# Patient Record
Sex: Female | Born: 1955
Health system: Southern US, Community
[De-identification: ages and names within clinical notes are randomized; demographics above are authoritative.]

## PROBLEM LIST (undated history)

## (undated) DIAGNOSIS — H269 Unspecified cataract: Secondary | ICD-10-CM

## (undated) DIAGNOSIS — Z8489 Family history of other specified conditions: Secondary | ICD-10-CM

## (undated) DIAGNOSIS — M1990 Unspecified osteoarthritis, unspecified site: Secondary | ICD-10-CM

## (undated) DIAGNOSIS — R519 Headache, unspecified: Secondary | ICD-10-CM

## (undated) DIAGNOSIS — R011 Cardiac murmur, unspecified: Secondary | ICD-10-CM

## (undated) DIAGNOSIS — R51 Headache: Secondary | ICD-10-CM

## (undated) DIAGNOSIS — M8580 Other specified disorders of bone density and structure, unspecified site: Secondary | ICD-10-CM

## (undated) HISTORY — PX: AUGMENTATION MAMMAPLASTY: SUR837

## (undated) HISTORY — PX: FACIAL COSMETIC SURGERY: SHX629

## (undated) HISTORY — PX: OTHER SURGICAL HISTORY: SHX169

## (undated) HISTORY — DX: Other specified disorders of bone density and structure, unspecified site: M85.80

## (undated) HISTORY — PX: BREAST SURGERY: SHX581

## (undated) HISTORY — PX: EYE SURGERY: SHX253

## (undated) HISTORY — PX: LASIK: SHX215

---

## 1997-10-05 ENCOUNTER — Ambulatory Visit (HOSPITAL_COMMUNITY): Admission: RE | Admit: 1997-10-05 | Discharge: 1997-10-05 | Payer: Self-pay | Admitting: Gynecology

## 1997-12-10 ENCOUNTER — Other Ambulatory Visit: Admission: RE | Admit: 1997-12-10 | Discharge: 1997-12-10 | Payer: Self-pay | Admitting: Gynecology

## 1998-11-16 ENCOUNTER — Other Ambulatory Visit: Admission: RE | Admit: 1998-11-16 | Discharge: 1998-11-16 | Payer: Self-pay | Admitting: Gynecology

## 1998-12-02 ENCOUNTER — Ambulatory Visit (HOSPITAL_COMMUNITY): Admission: RE | Admit: 1998-12-02 | Discharge: 1998-12-02 | Payer: Self-pay | Admitting: Gynecology

## 1998-12-02 ENCOUNTER — Encounter: Payer: Self-pay | Admitting: Gynecology

## 1999-11-16 ENCOUNTER — Other Ambulatory Visit: Admission: RE | Admit: 1999-11-16 | Discharge: 1999-11-16 | Payer: Self-pay | Admitting: Gynecology

## 2000-03-08 ENCOUNTER — Encounter: Payer: Self-pay | Admitting: Gynecology

## 2000-03-08 ENCOUNTER — Ambulatory Visit (HOSPITAL_COMMUNITY): Admission: RE | Admit: 2000-03-08 | Discharge: 2000-03-08 | Payer: Self-pay | Admitting: Gynecology

## 2000-03-28 ENCOUNTER — Other Ambulatory Visit: Admission: RE | Admit: 2000-03-28 | Discharge: 2000-03-28 | Payer: Self-pay | Admitting: Gynecology

## 2000-09-19 ENCOUNTER — Other Ambulatory Visit: Admission: RE | Admit: 2000-09-19 | Discharge: 2000-09-19 | Payer: Self-pay | Admitting: Gynecology

## 2000-12-11 ENCOUNTER — Other Ambulatory Visit: Admission: RE | Admit: 2000-12-11 | Discharge: 2000-12-11 | Payer: Self-pay | Admitting: Gynecology

## 2001-07-02 ENCOUNTER — Ambulatory Visit (HOSPITAL_COMMUNITY): Admission: RE | Admit: 2001-07-02 | Discharge: 2001-07-02 | Payer: Self-pay | Admitting: Gynecology

## 2001-07-02 ENCOUNTER — Encounter: Payer: Self-pay | Admitting: Gynecology

## 2001-09-16 ENCOUNTER — Encounter (INDEPENDENT_AMBULATORY_CARE_PROVIDER_SITE_OTHER): Payer: Self-pay | Admitting: Specialist

## 2001-09-16 ENCOUNTER — Ambulatory Visit (HOSPITAL_COMMUNITY): Admission: RE | Admit: 2001-09-16 | Discharge: 2001-09-16 | Payer: Self-pay | Admitting: Gastroenterology

## 2001-11-20 ENCOUNTER — Other Ambulatory Visit: Admission: RE | Admit: 2001-11-20 | Discharge: 2001-11-20 | Payer: Self-pay | Admitting: Gynecology

## 2002-07-22 ENCOUNTER — Ambulatory Visit (HOSPITAL_COMMUNITY): Admission: RE | Admit: 2002-07-22 | Discharge: 2002-07-22 | Payer: Self-pay | Admitting: Gynecology

## 2002-07-22 ENCOUNTER — Encounter: Payer: Self-pay | Admitting: Gynecology

## 2002-11-18 ENCOUNTER — Other Ambulatory Visit: Admission: RE | Admit: 2002-11-18 | Discharge: 2002-11-18 | Payer: Self-pay | Admitting: Gynecology

## 2003-08-19 ENCOUNTER — Ambulatory Visit (HOSPITAL_COMMUNITY): Admission: RE | Admit: 2003-08-19 | Discharge: 2003-08-19 | Payer: Self-pay | Admitting: Gynecology

## 2003-11-25 ENCOUNTER — Other Ambulatory Visit: Admission: RE | Admit: 2003-11-25 | Discharge: 2003-11-25 | Payer: Self-pay | Admitting: Gynecology

## 2004-06-27 ENCOUNTER — Encounter: Admission: RE | Admit: 2004-06-27 | Discharge: 2004-06-27 | Payer: Self-pay | Admitting: Gynecology

## 2004-09-30 ENCOUNTER — Ambulatory Visit (HOSPITAL_COMMUNITY): Admission: RE | Admit: 2004-09-30 | Discharge: 2004-09-30 | Payer: Self-pay | Admitting: Gynecology

## 2004-11-17 ENCOUNTER — Other Ambulatory Visit: Admission: RE | Admit: 2004-11-17 | Discharge: 2004-11-17 | Payer: Self-pay | Admitting: Gynecology

## 2005-10-26 ENCOUNTER — Ambulatory Visit (HOSPITAL_COMMUNITY): Admission: RE | Admit: 2005-10-26 | Discharge: 2005-10-26 | Payer: Self-pay | Admitting: Gynecology

## 2005-11-15 ENCOUNTER — Other Ambulatory Visit: Admission: RE | Admit: 2005-11-15 | Discharge: 2005-11-15 | Payer: Self-pay | Admitting: Gynecology

## 2006-11-19 ENCOUNTER — Other Ambulatory Visit: Admission: RE | Admit: 2006-11-19 | Discharge: 2006-11-19 | Payer: Self-pay | Admitting: Gynecology

## 2006-12-03 ENCOUNTER — Ambulatory Visit (HOSPITAL_COMMUNITY): Admission: RE | Admit: 2006-12-03 | Discharge: 2006-12-03 | Payer: Self-pay | Admitting: Gynecology

## 2007-04-04 ENCOUNTER — Encounter: Admission: RE | Admit: 2007-04-04 | Discharge: 2007-04-04 | Payer: Self-pay | Admitting: Orthopedic Surgery

## 2007-11-26 ENCOUNTER — Other Ambulatory Visit: Admission: RE | Admit: 2007-11-26 | Discharge: 2007-11-26 | Payer: Self-pay | Admitting: Gynecology

## 2007-12-03 ENCOUNTER — Ambulatory Visit (HOSPITAL_COMMUNITY): Admission: RE | Admit: 2007-12-03 | Discharge: 2007-12-03 | Payer: Self-pay | Admitting: Gynecology

## 2008-12-18 ENCOUNTER — Ambulatory Visit (HOSPITAL_COMMUNITY): Admission: RE | Admit: 2008-12-18 | Discharge: 2008-12-18 | Payer: Self-pay | Admitting: Gynecology

## 2009-12-23 ENCOUNTER — Ambulatory Visit (HOSPITAL_COMMUNITY): Admission: RE | Admit: 2009-12-23 | Discharge: 2009-12-23 | Payer: Self-pay | Admitting: Gynecology

## 2010-08-07 ENCOUNTER — Encounter: Payer: Self-pay | Admitting: Gynecology

## 2010-11-23 ENCOUNTER — Other Ambulatory Visit (HOSPITAL_COMMUNITY): Payer: Self-pay | Admitting: Gynecology

## 2010-11-23 DIAGNOSIS — Z1231 Encounter for screening mammogram for malignant neoplasm of breast: Secondary | ICD-10-CM

## 2010-12-30 ENCOUNTER — Ambulatory Visit (HOSPITAL_COMMUNITY)
Admission: RE | Admit: 2010-12-30 | Discharge: 2010-12-30 | Disposition: A | Discharge status: Home / Self Care | Payer: 59 | Source: Ambulatory Visit | Attending: Gynecology | Admitting: Gynecology

## 2010-12-30 DIAGNOSIS — Z1231 Encounter for screening mammogram for malignant neoplasm of breast: Secondary | ICD-10-CM

## 2011-12-25 ENCOUNTER — Other Ambulatory Visit (HOSPITAL_COMMUNITY): Payer: Self-pay | Admitting: Gynecology

## 2011-12-25 DIAGNOSIS — Z1231 Encounter for screening mammogram for malignant neoplasm of breast: Secondary | ICD-10-CM

## 2011-12-26 ENCOUNTER — Ambulatory Visit (HOSPITAL_COMMUNITY)
Admission: RE | Admit: 2011-12-26 | Discharge: 2011-12-26 | Disposition: A | Discharge status: Home / Self Care | Payer: 59 | Source: Ambulatory Visit | Attending: Gynecology | Admitting: Gynecology

## 2011-12-26 DIAGNOSIS — Z1231 Encounter for screening mammogram for malignant neoplasm of breast: Secondary | ICD-10-CM

## 2012-02-21 ENCOUNTER — Ambulatory Visit (HOSPITAL_COMMUNITY): Payer: 59

## 2012-03-20 ENCOUNTER — Ambulatory Visit (HOSPITAL_COMMUNITY): Payer: 59

## 2012-03-28 ENCOUNTER — Ambulatory Visit (HOSPITAL_COMMUNITY): Payer: 59

## 2012-05-06 ENCOUNTER — Other Ambulatory Visit: Payer: Self-pay | Admitting: Dermatology

## 2012-10-31 ENCOUNTER — Other Ambulatory Visit: Payer: Self-pay | Admitting: Orthopedic Surgery

## 2012-10-31 DIAGNOSIS — M25561 Pain in right knee: Secondary | ICD-10-CM

## 2012-11-06 ENCOUNTER — Ambulatory Visit
Admission: RE | Admit: 2012-11-06 | Discharge: 2012-11-06 | Disposition: A | Discharge status: Home / Self Care | Payer: 59 | Source: Ambulatory Visit | Attending: Orthopedic Surgery | Admitting: Orthopedic Surgery

## 2012-11-06 DIAGNOSIS — M25561 Pain in right knee: Secondary | ICD-10-CM

## 2013-05-01 ENCOUNTER — Other Ambulatory Visit: Payer: Self-pay | Admitting: Orthopedic Surgery

## 2013-05-01 DIAGNOSIS — M79672 Pain in left foot: Secondary | ICD-10-CM

## 2013-05-02 ENCOUNTER — Ambulatory Visit
Admission: RE | Admit: 2013-05-02 | Discharge: 2013-05-02 | Disposition: A | Discharge status: Home / Self Care | Payer: 59 | Source: Ambulatory Visit | Attending: Orthopedic Surgery | Admitting: Orthopedic Surgery

## 2013-05-02 ENCOUNTER — Other Ambulatory Visit: Payer: 59

## 2013-05-02 DIAGNOSIS — M79672 Pain in left foot: Secondary | ICD-10-CM

## 2014-04-22 ENCOUNTER — Other Ambulatory Visit: Payer: Self-pay | Admitting: Gynecology

## 2014-04-24 ENCOUNTER — Other Ambulatory Visit: Payer: Self-pay | Admitting: Gynecology

## 2014-04-24 DIAGNOSIS — R928 Other abnormal and inconclusive findings on diagnostic imaging of breast: Secondary | ICD-10-CM

## 2014-04-24 LAB — CYTOLOGY - PAP

## 2014-04-30 ENCOUNTER — Ambulatory Visit
Admission: RE | Admit: 2014-04-30 | Discharge: 2014-04-30 | Disposition: A | Discharge status: Home / Self Care | Payer: 59 | Source: Ambulatory Visit | Attending: Gynecology | Admitting: Gynecology

## 2014-04-30 ENCOUNTER — Encounter (INDEPENDENT_AMBULATORY_CARE_PROVIDER_SITE_OTHER): Payer: Self-pay

## 2014-04-30 ENCOUNTER — Other Ambulatory Visit: Payer: Self-pay | Admitting: Gynecology

## 2014-04-30 DIAGNOSIS — R928 Other abnormal and inconclusive findings on diagnostic imaging of breast: Secondary | ICD-10-CM

## 2014-05-06 ENCOUNTER — Other Ambulatory Visit: Payer: 59

## 2014-12-31 ENCOUNTER — Other Ambulatory Visit: Payer: Self-pay | Admitting: Orthopedic Surgery

## 2014-12-31 DIAGNOSIS — M25522 Pain in left elbow: Secondary | ICD-10-CM

## 2015-01-10 ENCOUNTER — Other Ambulatory Visit: Payer: Self-pay

## 2015-01-25 ENCOUNTER — Ambulatory Visit
Admission: RE | Admit: 2015-01-25 | Discharge: 2015-01-25 | Disposition: A | Discharge status: Home / Self Care | Payer: BLUE CROSS/BLUE SHIELD | Source: Ambulatory Visit | Attending: Orthopedic Surgery | Admitting: Orthopedic Surgery

## 2015-01-25 ENCOUNTER — Other Ambulatory Visit: Payer: Self-pay

## 2015-01-25 DIAGNOSIS — M25522 Pain in left elbow: Secondary | ICD-10-CM

## 2016-07-26 DIAGNOSIS — L97501 Non-pressure chronic ulcer of other part of unspecified foot limited to breakdown of skin: Secondary | ICD-10-CM | POA: Diagnosis not present

## 2016-07-26 DIAGNOSIS — L84 Corns and callosities: Secondary | ICD-10-CM | POA: Diagnosis not present

## 2016-08-17 DIAGNOSIS — H16141 Punctate keratitis, right eye: Secondary | ICD-10-CM | POA: Diagnosis not present

## 2016-08-17 DIAGNOSIS — H16142 Punctate keratitis, left eye: Secondary | ICD-10-CM | POA: Diagnosis not present

## 2016-10-04 DIAGNOSIS — H40023 Open angle with borderline findings, high risk, bilateral: Secondary | ICD-10-CM | POA: Diagnosis not present

## 2016-10-04 DIAGNOSIS — H43813 Vitreous degeneration, bilateral: Secondary | ICD-10-CM | POA: Diagnosis not present

## 2016-10-04 DIAGNOSIS — Z961 Presence of intraocular lens: Secondary | ICD-10-CM | POA: Diagnosis not present

## 2016-10-10 DIAGNOSIS — D2261 Melanocytic nevi of right upper limb, including shoulder: Secondary | ICD-10-CM | POA: Diagnosis not present

## 2016-10-10 DIAGNOSIS — L821 Other seborrheic keratosis: Secondary | ICD-10-CM | POA: Diagnosis not present

## 2016-10-10 DIAGNOSIS — D1801 Hemangioma of skin and subcutaneous tissue: Secondary | ICD-10-CM | POA: Diagnosis not present

## 2016-10-12 DIAGNOSIS — H16142 Punctate keratitis, left eye: Secondary | ICD-10-CM | POA: Diagnosis not present

## 2016-12-04 DIAGNOSIS — M5416 Radiculopathy, lumbar region: Secondary | ICD-10-CM | POA: Diagnosis not present

## 2017-01-04 DIAGNOSIS — M7751 Other enthesopathy of right foot: Secondary | ICD-10-CM | POA: Diagnosis not present

## 2017-02-01 DIAGNOSIS — M545 Low back pain: Secondary | ICD-10-CM | POA: Diagnosis not present

## 2017-02-01 DIAGNOSIS — M25551 Pain in right hip: Secondary | ICD-10-CM | POA: Diagnosis not present

## 2017-02-05 DIAGNOSIS — M5416 Radiculopathy, lumbar region: Secondary | ICD-10-CM | POA: Diagnosis not present

## 2017-02-05 DIAGNOSIS — S39012D Strain of muscle, fascia and tendon of lower back, subsequent encounter: Secondary | ICD-10-CM | POA: Diagnosis not present

## 2017-02-08 DIAGNOSIS — M5416 Radiculopathy, lumbar region: Secondary | ICD-10-CM | POA: Diagnosis not present

## 2017-02-08 DIAGNOSIS — S39012D Strain of muscle, fascia and tendon of lower back, subsequent encounter: Secondary | ICD-10-CM | POA: Diagnosis not present

## 2017-02-12 ENCOUNTER — Other Ambulatory Visit: Payer: Self-pay | Admitting: Orthopedic Surgery

## 2017-02-12 DIAGNOSIS — M545 Low back pain: Secondary | ICD-10-CM

## 2017-02-12 DIAGNOSIS — M5416 Radiculopathy, lumbar region: Secondary | ICD-10-CM | POA: Diagnosis not present

## 2017-02-12 DIAGNOSIS — S39012D Strain of muscle, fascia and tendon of lower back, subsequent encounter: Secondary | ICD-10-CM | POA: Diagnosis not present

## 2017-02-15 DIAGNOSIS — S39012D Strain of muscle, fascia and tendon of lower back, subsequent encounter: Secondary | ICD-10-CM | POA: Diagnosis not present

## 2017-02-15 DIAGNOSIS — M5416 Radiculopathy, lumbar region: Secondary | ICD-10-CM | POA: Diagnosis not present

## 2017-02-17 DIAGNOSIS — M545 Low back pain: Secondary | ICD-10-CM | POA: Diagnosis not present

## 2017-02-19 DIAGNOSIS — S39012D Strain of muscle, fascia and tendon of lower back, subsequent encounter: Secondary | ICD-10-CM | POA: Diagnosis not present

## 2017-02-19 DIAGNOSIS — M5416 Radiculopathy, lumbar region: Secondary | ICD-10-CM | POA: Diagnosis not present

## 2017-02-22 ENCOUNTER — Other Ambulatory Visit: Payer: BLUE CROSS/BLUE SHIELD

## 2017-02-27 DIAGNOSIS — M7138 Other bursal cyst, other site: Secondary | ICD-10-CM | POA: Diagnosis not present

## 2017-02-27 DIAGNOSIS — M5416 Radiculopathy, lumbar region: Secondary | ICD-10-CM | POA: Diagnosis not present

## 2017-02-27 DIAGNOSIS — M546 Pain in thoracic spine: Secondary | ICD-10-CM | POA: Diagnosis not present

## 2017-02-27 DIAGNOSIS — M4316 Spondylolisthesis, lumbar region: Secondary | ICD-10-CM | POA: Diagnosis not present

## 2017-02-27 DIAGNOSIS — M549 Dorsalgia, unspecified: Secondary | ICD-10-CM | POA: Diagnosis not present

## 2017-02-28 ENCOUNTER — Other Ambulatory Visit: Payer: BLUE CROSS/BLUE SHIELD

## 2017-02-28 DIAGNOSIS — M7138 Other bursal cyst, other site: Secondary | ICD-10-CM | POA: Diagnosis not present

## 2017-02-28 DIAGNOSIS — M5136 Other intervertebral disc degeneration, lumbar region: Secondary | ICD-10-CM | POA: Diagnosis not present

## 2017-02-28 DIAGNOSIS — M48061 Spinal stenosis, lumbar region without neurogenic claudication: Secondary | ICD-10-CM | POA: Diagnosis not present

## 2017-03-28 DIAGNOSIS — H16223 Keratoconjunctivitis sicca, not specified as Sjogren's, bilateral: Secondary | ICD-10-CM | POA: Diagnosis not present

## 2017-03-28 DIAGNOSIS — H18001 Unspecified corneal deposit, right eye: Secondary | ICD-10-CM | POA: Diagnosis not present

## 2017-04-02 DIAGNOSIS — N950 Postmenopausal bleeding: Secondary | ICD-10-CM | POA: Diagnosis not present

## 2017-04-10 DIAGNOSIS — M4316 Spondylolisthesis, lumbar region: Secondary | ICD-10-CM | POA: Diagnosis not present

## 2017-04-10 DIAGNOSIS — M4156 Other secondary scoliosis, lumbar region: Secondary | ICD-10-CM | POA: Diagnosis not present

## 2017-04-10 DIAGNOSIS — M7138 Other bursal cyst, other site: Secondary | ICD-10-CM | POA: Diagnosis not present

## 2017-04-16 DIAGNOSIS — Z23 Encounter for immunization: Secondary | ICD-10-CM | POA: Diagnosis not present

## 2017-05-14 DIAGNOSIS — M5136 Other intervertebral disc degeneration, lumbar region: Secondary | ICD-10-CM | POA: Diagnosis not present

## 2017-05-14 DIAGNOSIS — M7138 Other bursal cyst, other site: Secondary | ICD-10-CM | POA: Diagnosis not present

## 2017-05-14 DIAGNOSIS — M4316 Spondylolisthesis, lumbar region: Secondary | ICD-10-CM | POA: Diagnosis not present

## 2017-05-17 DIAGNOSIS — M48061 Spinal stenosis, lumbar region without neurogenic claudication: Secondary | ICD-10-CM | POA: Diagnosis not present

## 2017-05-17 DIAGNOSIS — M4726 Other spondylosis with radiculopathy, lumbar region: Secondary | ICD-10-CM | POA: Diagnosis not present

## 2017-05-17 DIAGNOSIS — M5136 Other intervertebral disc degeneration, lumbar region: Secondary | ICD-10-CM | POA: Diagnosis not present

## 2017-05-24 DIAGNOSIS — T23171A Burn of first degree of right wrist, initial encounter: Secondary | ICD-10-CM | POA: Diagnosis not present

## 2017-05-29 ENCOUNTER — Other Ambulatory Visit: Payer: Self-pay | Admitting: Neurosurgery

## 2017-05-29 DIAGNOSIS — M4316 Spondylolisthesis, lumbar region: Secondary | ICD-10-CM | POA: Diagnosis not present

## 2017-05-29 DIAGNOSIS — M7138 Other bursal cyst, other site: Secondary | ICD-10-CM | POA: Diagnosis not present

## 2017-05-29 DIAGNOSIS — M47816 Spondylosis without myelopathy or radiculopathy, lumbar region: Secondary | ICD-10-CM | POA: Diagnosis not present

## 2017-05-29 DIAGNOSIS — M4156 Other secondary scoliosis, lumbar region: Secondary | ICD-10-CM | POA: Diagnosis not present

## 2017-06-06 DIAGNOSIS — Z6821 Body mass index (BMI) 21.0-21.9, adult: Secondary | ICD-10-CM | POA: Diagnosis not present

## 2017-06-06 DIAGNOSIS — Z01419 Encounter for gynecological examination (general) (routine) without abnormal findings: Secondary | ICD-10-CM | POA: Diagnosis not present

## 2017-06-06 DIAGNOSIS — Z1231 Encounter for screening mammogram for malignant neoplasm of breast: Secondary | ICD-10-CM | POA: Diagnosis not present

## 2017-06-12 DIAGNOSIS — L820 Inflamed seborrheic keratosis: Secondary | ICD-10-CM | POA: Diagnosis not present

## 2017-07-13 NOTE — Pre-Procedure Instructions (Signed)
Heidi Skinner  07/13/2017      [facility] [place], [place] [address] Phone: [phone] Fax: [phone]    Your procedure is scheduled on  Thursday 07/19/17  Report to [facility] Admitting at 530 A.M.  Call this number if you have problems the morning of surgery:  [phone]   Remember:  Do not eat food or drink liquids after midnight.  Take these medicines the morning of surgery with A SIP OF WATER  TYLENOL IF NEEDED, EYE DROPS  7 days prior to surgery STOP taking any Aspirin(unless otherwise instructed by your surgeon), Aleve, Naproxen, DICLOFENAC/VOLTAREN,  Ibuprofen, Motrin, Advil, Goody's, BC's, all herbal medications, fish oil, and all vitamins   Do not wear jewelry, make-up or nail polish.  Do not wear lotions, powders, or perfumes, or deodorant.  Do not shave 48 hours prior to surgery.  Men may shave face and neck.  Do not bring valuables to the hospital.  [facility] is not responsible for any belongings or valuables.  Contacts, dentures or bridgework may not be worn into surgery.  Leave your suitcase in the car.  After surgery it may be brought to your room.  For patients admitted to the hospital, discharge time will be determined by your treatment team.  Patients discharged the day of surgery will not be allowed to drive home.   Name and phone number of your driver:    Special instructions:  [place] - Preparing for Surgery  Before surgery, you can play an important role.  Because skin is not sterile, your skin needs to be as free of germs as possible.  You can reduce the number of germs on you skin by washing with CHG (chlorahexidine gluconate) soap before surgery.  CHG is an antiseptic cleaner which kills germs and bonds with the skin to continue killing germs even after washing.  Please DO NOT use if you have an allergy to CHG or antibacterial soaps.  If your  skin becomes reddened/irritated stop using the CHG and inform your nurse when you arrive at Short Stay.  Do not shave (including legs and underarms) for at least 48 hours prior to the first CHG shower.  You may shave your face.  Please follow these instructions carefully:   1.  Shower with CHG Soap the night before surgery and the                                morning of Surgery.  2.  If you choose to wash your hair, wash your hair first as usual with your       normal shampoo.  3.  After you shampoo, rinse your hair and body thoroughly to remove the                      Shampoo.  4.  Use CHG as you would any other liquid soap.  You can apply chg directly       to the skin and wash gently with scrungie or a clean washcloth.  5.  Apply the CHG Soap to your body ONLY FROM THE NECK DOWN.        Do not use on open wounds or open sores.  Avoid contact with your eyes,       ears, mouth and genitals (private parts).  Wash genitals (  private parts)       with your normal soap.  6.  Wash thoroughly, paying special attention to the area where your surgery        will be performed.  7.  Thoroughly rinse your body with warm water from the neck down.  8.  DO NOT shower/wash with your normal soap after using and rinsing off       the CHG Soap.  9.  Pat yourself dry with a clean towel.            10.  Wear clean pajamas.            11.  Place clean sheets on your bed the night of your first shower and do not        sleep with pets.  Day of Surgery  Do not apply any lotions/deoderants the morning of surgery.  Please wear clean clothes to the hospital/surgery center.    Please read over the following fact sheets that you were given. MRSA Information and Surgical Site Infection Prevention

## 2017-07-16 ENCOUNTER — Encounter (HOSPITAL_COMMUNITY)
Admission: RE | Admit: 2017-07-16 | Discharge: 2017-07-16 | Disposition: A | Discharge status: Home / Self Care | Payer: 59 | Source: Ambulatory Visit | Attending: Neurosurgery | Admitting: Neurosurgery

## 2017-07-16 ENCOUNTER — Encounter (HOSPITAL_COMMUNITY): Payer: Self-pay

## 2017-07-16 ENCOUNTER — Other Ambulatory Visit: Payer: Self-pay

## 2017-07-16 DIAGNOSIS — M4726 Other spondylosis with radiculopathy, lumbar region: Secondary | ICD-10-CM | POA: Diagnosis not present

## 2017-07-16 DIAGNOSIS — M7138 Other bursal cyst, other site: Secondary | ICD-10-CM | POA: Diagnosis not present

## 2017-07-16 DIAGNOSIS — M48061 Spinal stenosis, lumbar region without neurogenic claudication: Secondary | ICD-10-CM | POA: Diagnosis not present

## 2017-07-16 HISTORY — DX: Headache: R51

## 2017-07-16 HISTORY — DX: Family history of other specified conditions: Z84.89

## 2017-07-16 HISTORY — DX: Unspecified cataract: H26.9

## 2017-07-16 HISTORY — DX: Unspecified osteoarthritis, unspecified site: M19.90

## 2017-07-16 HISTORY — DX: Cardiac murmur, unspecified: R01.1

## 2017-07-16 HISTORY — DX: Headache, unspecified: R51.9

## 2017-07-16 LAB — BASIC METABOLIC PANEL
Anion gap: 9 (ref 5–15)
BUN: 13 mg/dL (ref 6–20)
CO2: 25 mmol/L (ref 22–32)
Calcium: 9.8 mg/dL (ref 8.9–10.3)
Chloride: 101 mmol/L (ref 101–111)
Creatinine, Ser: 0.69 mg/dL (ref 0.44–1.00)
GFR calc Af Amer: >60 mL/min (ref >60)
GFR calc non Af Amer: >60 mL/min (ref >60)
Glucose, Bld: 90 mg/dL (ref 65–99)
Potassium: 4 mmol/L (ref 3.5–5.1)
Sodium: 135 mmol/L (ref 135–145)

## 2017-07-16 LAB — CBC
HCT: 39 % (ref 36.0–46.0)
Hemoglobin: 12.8 g/dL (ref 12.0–15.0)
MCH: 31.8 pg (ref 26.0–34.0)
MCHC: 32.8 g/dL (ref 30.0–36.0)
MCV: 97 fL (ref 78.0–100.0)
Platelets: 237 10*3/uL (ref 150–400)
RBC: 4.02 MIL/uL (ref 3.87–5.11)
RDW: 13.6 % (ref 11.5–15.5)
WBC: 5.9 10*3/uL (ref 4.0–10.5)

## 2017-07-16 LAB — TYPE AND SCREEN
ABO/RH(D): O POS
Antibody Screen: NEGATIVE

## 2017-07-16 LAB — SURGICAL PCR SCREEN
MRSA, PCR: NEGATIVE
Staphylococcus aureus: NEGATIVE

## 2017-07-16 LAB — ABO/RH: ABO/RH(D): O POS

## 2017-07-16 NOTE — Progress Notes (Addendum)
PCP is Dr. Lavone Orn  LOV 08/2015?   Approx 10 - 15 yrs ago, she was experiencing palpitations.  Had a stress test ( - ) seen by Dr. Linard Millers.  Then, when pregnant with first child, she was told she had murmur.  With subsequent pregnancies, nothing was said or mentioned.  Has not been back since. Denies cp, sob, palpitations.

## 2017-07-19 ENCOUNTER — Encounter (HOSPITAL_COMMUNITY): Payer: Self-pay | Admitting: *Deleted

## 2017-07-19 ENCOUNTER — Encounter (HOSPITAL_COMMUNITY)
Admission: RE | Disposition: A | Discharge status: Home / Self Care | Payer: Self-pay | Source: Ambulatory Visit | Attending: Neurosurgery

## 2017-07-19 ENCOUNTER — Inpatient Hospital Stay (HOSPITAL_COMMUNITY): Payer: 59 | Admitting: Anesthesiology

## 2017-07-19 ENCOUNTER — Other Ambulatory Visit: Payer: Self-pay

## 2017-07-19 ENCOUNTER — Inpatient Hospital Stay (HOSPITAL_COMMUNITY): Payer: 59

## 2017-07-19 ENCOUNTER — Inpatient Hospital Stay (HOSPITAL_COMMUNITY)
Admission: RE | Admit: 2017-07-19 | Discharge: 2017-07-20 | DRG: 455 | Disposition: A | Discharge status: Home / Self Care | Payer: 59 | Source: Ambulatory Visit | Attending: Neurosurgery | Admitting: Neurosurgery

## 2017-07-19 DIAGNOSIS — Z880 Allergy status to penicillin: Secondary | ICD-10-CM

## 2017-07-19 DIAGNOSIS — Z791 Long term (current) use of non-steroidal anti-inflammatories (NSAID): Secondary | ICD-10-CM

## 2017-07-19 DIAGNOSIS — M4316 Spondylolisthesis, lumbar region: Secondary | ICD-10-CM | POA: Diagnosis not present

## 2017-07-19 DIAGNOSIS — Z79899 Other long term (current) drug therapy: Secondary | ICD-10-CM

## 2017-07-19 DIAGNOSIS — Z882 Allergy status to sulfonamides status: Secondary | ICD-10-CM

## 2017-07-19 DIAGNOSIS — M4726 Other spondylosis with radiculopathy, lumbar region: Secondary | ICD-10-CM | POA: Diagnosis not present

## 2017-07-19 DIAGNOSIS — M48061 Spinal stenosis, lumbar region without neurogenic claudication: Secondary | ICD-10-CM | POA: Diagnosis not present

## 2017-07-19 DIAGNOSIS — M4326 Fusion of spine, lumbar region: Secondary | ICD-10-CM | POA: Diagnosis not present

## 2017-07-19 DIAGNOSIS — M7138 Other bursal cyst, other site: Secondary | ICD-10-CM | POA: Diagnosis present

## 2017-07-19 DIAGNOSIS — Z419 Encounter for procedure for purposes other than remedying health state, unspecified: Secondary | ICD-10-CM

## 2017-07-19 HISTORY — PX: BACK SURGERY: SHX140

## 2017-07-19 SURGERY — POSTERIOR LUMBAR FUSION 1 LEVEL
Anesthesia: General | Site: Spine Lumbar

## 2017-07-19 MED ORDER — MENTHOL 3 MG MT LOZG: 1.0000 | LOZENGE | OROMUCOSAL | Status: DC | PRN

## 2017-07-19 MED ORDER — THROMBIN (RECOMBINANT) 20000 UNITS EX SOLR
CUTANEOUS | Status: AC
  Filled 2017-07-19: qty 20000

## 2017-07-19 MED ORDER — DEXAMETHASONE SODIUM PHOSPHATE 10 MG/ML IJ SOLN
INTRAMUSCULAR | Status: AC
  Filled 2017-07-19: qty 1

## 2017-07-19 MED ORDER — EPHEDRINE SULFATE-NACL 50-0.9 MG/10ML-% IV SOSY
PREFILLED_SYRINGE | INTRAVENOUS | Status: DC | PRN
  Administered 2017-07-19 (×2): 10 mg via INTRAVENOUS

## 2017-07-19 MED ORDER — SODIUM CHLORIDE 0.9 % IV SOLN: 250.0000 mL | INTRAVENOUS | Status: DC

## 2017-07-19 MED ORDER — CYCLOBENZAPRINE HCL 5 MG PO TABS: 5.0000 mg | ORAL_TABLET | Freq: Three times a day (TID) | ORAL | Status: DC | PRN

## 2017-07-19 MED ORDER — VANCOMYCIN HCL 1000 MG IV SOLR
INTRAVENOUS | Status: DC | PRN
  Administered 2017-07-19: 1000 mg via INTRAVENOUS

## 2017-07-19 MED ORDER — ACETAMINOPHEN 325 MG PO TABS: 650.0000 mg | ORAL_TABLET | ORAL | Status: DC | PRN

## 2017-07-19 MED ORDER — BISACODYL 10 MG RE SUPP: 10.0000 mg | Freq: Every day | RECTAL | Status: DC | PRN

## 2017-07-19 MED ORDER — SODIUM CHLORIDE 0.9% FLUSH
3.0000 mL | INTRAVENOUS | Status: DC | PRN
Start: 2017-07-19 — End: 2017-07-20

## 2017-07-19 MED ORDER — ROCURONIUM BROMIDE 100 MG/10ML IV SOLN
INTRAVENOUS | Status: DC | PRN
  Administered 2017-07-19: 50 mg via INTRAVENOUS

## 2017-07-19 MED ORDER — BUPIVACAINE HCL (PF) 0.5 % IJ SOLN
INTRAMUSCULAR | Status: DC | PRN
  Administered 2017-07-19: 15 mL

## 2017-07-19 MED ORDER — MIDAZOLAM HCL 2 MG/2ML IJ SOLN
INTRAMUSCULAR | Status: AC
  Filled 2017-07-19: qty 2

## 2017-07-19 MED ORDER — CHLORHEXIDINE GLUCONATE CLOTH 2 % EX PADS: 6.0000 | MEDICATED_PAD | Freq: Once | CUTANEOUS | Status: DC

## 2017-07-19 MED ORDER — FENTANYL CITRATE (PF) 250 MCG/5ML IJ SOLN
INTRAMUSCULAR | Status: AC
  Filled 2017-07-19: qty 5

## 2017-07-19 MED ORDER — HYDROXYZINE HCL 25 MG PO TABS: 50.0000 mg | ORAL_TABLET | ORAL | Status: DC | PRN

## 2017-07-19 MED ORDER — SODIUM CHLORIDE 0.9 % IV BOLUS (SEPSIS)
500.0000 mL | Freq: Once | INTRAVENOUS | Status: AC
  Administered 2017-07-19: 500 mL via INTRAVENOUS

## 2017-07-19 MED ORDER — GENTAMICIN IN SALINE 1.6-0.9 MG/ML-% IV SOLN
80.0000 mg | INTRAVENOUS | Status: AC
  Administered 2017-07-19: 80 mg via INTRAVENOUS
  Filled 2017-07-19: qty 50

## 2017-07-19 MED ORDER — 0.9 % SODIUM CHLORIDE (POUR BTL) OPTIME
TOPICAL | Status: DC | PRN
Start: 2017-07-19 — End: 2017-07-19
  Administered 2017-07-19 (×2): 1000 mL

## 2017-07-19 MED ORDER — ONDANSETRON HCL 4 MG/2ML IJ SOLN
INTRAMUSCULAR | Status: AC
  Filled 2017-07-19: qty 2

## 2017-07-19 MED ORDER — THROMBIN (RECOMBINANT) 20000 UNITS EX SOLR
CUTANEOUS | Status: DC | PRN
  Administered 2017-07-19: 09:00:00 via TOPICAL

## 2017-07-19 MED ORDER — PHENOL 1.4 % MT LIQD: 1.0000 | OROMUCOSAL | Status: DC | PRN

## 2017-07-19 MED ORDER — VANCOMYCIN HCL IN DEXTROSE 1-5 GM/200ML-% IV SOLN
1000.0000 mg | INTRAVENOUS | Status: DC
  Filled 2017-07-19: qty 200

## 2017-07-19 MED ORDER — BUPIVACAINE HCL (PF) 0.5 % IJ SOLN
INTRAMUSCULAR | Status: AC
  Filled 2017-07-19: qty 30

## 2017-07-19 MED ORDER — SODIUM CHLORIDE 0.9 % IR SOLN
Status: DC | PRN
  Administered 2017-07-19: 09:00:00

## 2017-07-19 MED ORDER — SODIUM CHLORIDE 0.9% FLUSH
3.0000 mL | Freq: Two times a day (BID) | INTRAVENOUS | Status: DC
  Administered 2017-07-19: 3 mL via INTRAVENOUS

## 2017-07-19 MED ORDER — FLEET ENEMA 7-19 GM/118ML RE ENEM: 1.0000 | ENEMA | Freq: Once | RECTAL | Status: DC | PRN

## 2017-07-19 MED ORDER — DEXTROSE 5 % IV SOLN
INTRAVENOUS | Status: DC | PRN
  Administered 2017-07-19: 20 ug/min via INTRAVENOUS

## 2017-07-19 MED ORDER — MEPERIDINE HCL 25 MG/ML IJ SOLN: 6.2500 mg | INTRAMUSCULAR | Status: DC | PRN

## 2017-07-19 MED ORDER — LIDOCAINE-EPINEPHRINE 1 %-1:100000 IJ SOLN
INTRAMUSCULAR | Status: AC
  Filled 2017-07-19: qty 1

## 2017-07-19 MED ORDER — CEFAZOLIN SODIUM-DEXTROSE 2-4 GM/100ML-% IV SOLN
INTRAVENOUS | Status: AC
  Filled 2017-07-19: qty 100

## 2017-07-19 MED ORDER — PROPOFOL 10 MG/ML IV BOLUS
INTRAVENOUS | Status: DC | PRN
  Administered 2017-07-19: 100 mg via INTRAVENOUS

## 2017-07-19 MED ORDER — THROMBIN (RECOMBINANT) 5000 UNITS EX SOLR
OROMUCOSAL | Status: DC | PRN
  Administered 2017-07-19: 09:00:00 via TOPICAL

## 2017-07-19 MED ORDER — ALUM & MAG HYDROXIDE-SIMETH 200-200-20 MG/5ML PO SUSP: 30.0000 mL | Freq: Four times a day (QID) | ORAL | Status: DC | PRN

## 2017-07-19 MED ORDER — ONDANSETRON HCL 4 MG/2ML IJ SOLN: 4.0000 mg | Freq: Once | INTRAMUSCULAR | Status: DC | PRN

## 2017-07-19 MED ORDER — KETOROLAC TROMETHAMINE 30 MG/ML IJ SOLN
INTRAMUSCULAR | Status: AC
  Administered 2017-07-19: 30 mg via INTRAVENOUS
  Filled 2017-07-19: qty 1

## 2017-07-19 MED ORDER — DEXAMETHASONE SODIUM PHOSPHATE 4 MG/ML IJ SOLN
INTRAMUSCULAR | Status: DC | PRN
  Administered 2017-07-19: 10 mg via INTRAVENOUS

## 2017-07-19 MED ORDER — MAGNESIUM HYDROXIDE 400 MG/5ML PO SUSP: 30.0000 mL | Freq: Every day | ORAL | Status: DC | PRN

## 2017-07-19 MED ORDER — LACTATED RINGERS IV SOLN
INTRAVENOUS | Status: DC | PRN
  Administered 2017-07-19 (×2): via INTRAVENOUS

## 2017-07-19 MED ORDER — LIDOCAINE 2% (20 MG/ML) 5 ML SYRINGE
INTRAMUSCULAR | Status: AC
  Filled 2017-07-19: qty 5

## 2017-07-19 MED ORDER — FENTANYL CITRATE (PF) 100 MCG/2ML IJ SOLN
INTRAMUSCULAR | Status: DC | PRN
  Administered 2017-07-19: 25 ug via INTRAVENOUS
  Administered 2017-07-19: 50 ug via INTRAVENOUS
  Administered 2017-07-19: 25 ug via INTRAVENOUS
  Administered 2017-07-19: 100 ug via INTRAVENOUS
  Administered 2017-07-19: 50 ug via INTRAVENOUS

## 2017-07-19 MED ORDER — HYDROMORPHONE HCL 1 MG/ML IJ SOLN
INTRAMUSCULAR | Status: AC
  Filled 2017-07-19: qty 1

## 2017-07-19 MED ORDER — KCL IN DEXTROSE-NACL 20-5-0.45 MEQ/L-%-% IV SOLN: INTRAVENOUS | Status: DC

## 2017-07-19 MED ORDER — PROPOFOL 10 MG/ML IV BOLUS
INTRAVENOUS | Status: AC
  Filled 2017-07-19: qty 40

## 2017-07-19 MED ORDER — PHENYLEPHRINE 40 MCG/ML (10ML) SYRINGE FOR IV PUSH (FOR BLOOD PRESSURE SUPPORT)
PREFILLED_SYRINGE | INTRAVENOUS | Status: DC | PRN
  Administered 2017-07-19 (×2): 80 ug via INTRAVENOUS
  Administered 2017-07-19: 40 ug via INTRAVENOUS
  Administered 2017-07-19: 80 ug via INTRAVENOUS
  Administered 2017-07-19 (×2): 40 ug via INTRAVENOUS

## 2017-07-19 MED ORDER — ACETAMINOPHEN 10 MG/ML IV SOLN
INTRAVENOUS | Status: DC | PRN
  Administered 2017-07-19: 1000 mg via INTRAVENOUS

## 2017-07-19 MED ORDER — MORPHINE SULFATE (PF) 4 MG/ML IV SOLN: 4.0000 mg | INTRAVENOUS | Status: DC | PRN

## 2017-07-19 MED ORDER — ONDANSETRON HCL 4 MG/2ML IJ SOLN
INTRAMUSCULAR | Status: DC | PRN
  Administered 2017-07-19: 4 mg via INTRAVENOUS

## 2017-07-19 MED ORDER — HYDROMORPHONE HCL 1 MG/ML IJ SOLN
0.2500 mg | INTRAMUSCULAR | Status: DC | PRN
  Administered 2017-07-19: 0.5 mg via INTRAVENOUS

## 2017-07-19 MED ORDER — KETOROLAC TROMETHAMINE 30 MG/ML IJ SOLN
30.0000 mg | Freq: Four times a day (QID) | INTRAMUSCULAR | Status: DC
  Administered 2017-07-19 – 2017-07-20 (×4): 30 mg via INTRAVENOUS
  Filled 2017-07-19 (×4): qty 1

## 2017-07-19 MED ORDER — HYDROCODONE-ACETAMINOPHEN 5-325 MG PO TABS
1.0000 | ORAL_TABLET | ORAL | Status: DC | PRN
  Administered 2017-07-19 – 2017-07-20 (×4): 1 via ORAL
  Filled 2017-07-19 (×4): qty 1

## 2017-07-19 MED ORDER — ONDANSETRON HCL 4 MG PO TABS: 4.0000 mg | ORAL_TABLET | Freq: Four times a day (QID) | ORAL | Status: DC | PRN

## 2017-07-19 MED ORDER — EPHEDRINE 5 MG/ML INJ
INTRAVENOUS | Status: AC
Start: 2017-07-19 — End: 2017-07-19
  Filled 2017-07-19: qty 10

## 2017-07-19 MED ORDER — ONDANSETRON HCL 4 MG/2ML IJ SOLN: 4.0000 mg | Freq: Four times a day (QID) | INTRAMUSCULAR | Status: DC | PRN

## 2017-07-19 MED ORDER — MIDAZOLAM HCL 5 MG/5ML IJ SOLN
INTRAMUSCULAR | Status: DC | PRN
  Administered 2017-07-19: 2 mg via INTRAVENOUS

## 2017-07-19 MED ORDER — LIDOCAINE-EPINEPHRINE 1 %-1:100000 IJ SOLN
INTRAMUSCULAR | Status: DC | PRN
  Administered 2017-07-19: 15 mL

## 2017-07-19 MED ORDER — ACETAMINOPHEN 10 MG/ML IV SOLN
INTRAVENOUS | Status: AC
  Filled 2017-07-19: qty 100

## 2017-07-19 MED ORDER — KETOROLAC TROMETHAMINE 30 MG/ML IJ SOLN
30.0000 mg | Freq: Once | INTRAMUSCULAR | Status: AC
Start: 2017-07-19 — End: 2017-07-19
  Administered 2017-07-19: 30 mg via INTRAVENOUS

## 2017-07-19 MED ORDER — HYDROXYZINE HCL 50 MG/ML IM SOLN: 50.0000 mg | INTRAMUSCULAR | Status: DC | PRN

## 2017-07-19 MED ORDER — ROCURONIUM BROMIDE 10 MG/ML (PF) SYRINGE
PREFILLED_SYRINGE | INTRAVENOUS | Status: AC
  Filled 2017-07-19: qty 5

## 2017-07-19 MED ORDER — THROMBIN (RECOMBINANT) 5000 UNITS EX SOLR
CUTANEOUS | Status: AC
  Filled 2017-07-19: qty 5000

## 2017-07-19 MED ORDER — CEFAZOLIN SODIUM-DEXTROSE 2-4 GM/100ML-% IV SOLN: 2.0000 g | INTRAVENOUS | Status: DC

## 2017-07-19 MED ORDER — LIDOCAINE 2% (20 MG/ML) 5 ML SYRINGE
INTRAMUSCULAR | Status: DC | PRN
  Administered 2017-07-19: 100 mg via INTRAVENOUS

## 2017-07-19 MED ORDER — ACETAMINOPHEN 650 MG RE SUPP: 650.0000 mg | RECTAL | Status: DC | PRN

## 2017-07-19 SURGICAL SUPPLY — 73 items
ADH SKN CLS APL DERMABOND .7 (GAUZE/BANDAGES/DRESSINGS) ×1
APL SKNCLS STERI-STRIP NONHPOA (GAUZE/BANDAGES/DRESSINGS) ×1
BAG DECANTER FOR FLEXI CONT (MISCELLANEOUS) ×2 IMPLANT
BENZOIN TINCTURE PRP APPL 2/3 (GAUZE/BANDAGES/DRESSINGS) ×2 IMPLANT
BUR ACRON 5.0MM COATED (BURR) ×2 IMPLANT
BUR MATCHSTICK NEURO 3.0 LAGG (BURR) ×2 IMPLANT
CANISTER SUCT 3000ML PPV (MISCELLANEOUS) ×2 IMPLANT
CAP RELINE MOD TULIP RMM (Cap) ×2 IMPLANT
CARTRIDGE OIL MAESTRO DRILL (MISCELLANEOUS) ×1 IMPLANT
CLIP NEUROVISION LG (CLIP) ×1 IMPLANT
CONT SPEC 4OZ CLIKSEAL STRL BL (MISCELLANEOUS) ×2 IMPLANT
COVER BACK TABLE 60X90IN (DRAPES) ×2 IMPLANT
DERMABOND ADVANCED (GAUZE/BANDAGES/DRESSINGS) ×1
DERMABOND ADVANCED .7 DNX12 (GAUZE/BANDAGES/DRESSINGS) ×1 IMPLANT
DIFFUSER DRILL AIR PNEUMATIC (MISCELLANEOUS) ×2 IMPLANT
DRAPE C-ARM 42X72 X-RAY (DRAPES) ×4 IMPLANT
DRAPE C-ARMOR (DRAPES) ×1 IMPLANT
DRAPE HALF SHEET 40X57 (DRAPES) ×1 IMPLANT
DRAPE LAPAROTOMY 100X72X124 (DRAPES) ×2 IMPLANT
DRAPE POUCH INSTRU U-SHP 10X18 (DRAPES) ×2 IMPLANT
ELECT REM PT RETURN 9FT ADLT (ELECTROSURGICAL) ×2
ELECTRODE REM PT RTRN 9FT ADLT (ELECTROSURGICAL) ×1 IMPLANT
GAUZE SPONGE 4X4 12PLY STRL (GAUZE/BANDAGES/DRESSINGS) ×2 IMPLANT
GAUZE SPONGE 4X4 16PLY XRAY LF (GAUZE/BANDAGES/DRESSINGS) IMPLANT
GLOVE BIOGEL PI IND STRL 7.5 (GLOVE) IMPLANT
GLOVE BIOGEL PI IND STRL 8 (GLOVE) ×2 IMPLANT
GLOVE BIOGEL PI INDICATOR 7.5 (GLOVE) ×3
GLOVE BIOGEL PI INDICATOR 8 (GLOVE) ×4
GLOVE ECLIPSE 7.0 STRL STRAW (GLOVE) ×1 IMPLANT
GLOVE ECLIPSE 7.5 STRL STRAW (GLOVE) ×7 IMPLANT
GOWN STRL REUS W/ TWL LRG LVL3 (GOWN DISPOSABLE) IMPLANT
GOWN STRL REUS W/ TWL XL LVL3 (GOWN DISPOSABLE) ×2 IMPLANT
GOWN STRL REUS W/TWL 2XL LVL3 (GOWN DISPOSABLE) ×2 IMPLANT
GOWN STRL REUS W/TWL LRG LVL3 (GOWN DISPOSABLE) ×2
GOWN STRL REUS W/TWL XL LVL3 (GOWN DISPOSABLE) ×4
KIT BASIN OR (CUSTOM PROCEDURE TRAY) ×2 IMPLANT
KIT INFUSE X SMALL 1.4CC (Orthopedic Implant) ×1 IMPLANT
KIT ROOM TURNOVER OR (KITS) ×2 IMPLANT
MODULE NVM5 NEXT GEN EMG (NEEDLE) ×1 IMPLANT
NDL ASP BONE MRW 8GX15 (NEEDLE) IMPLANT
NDL SPNL 18GX3.5 QUINCKE PK (NEEDLE) ×1 IMPLANT
NDL SPNL 22GX3.5 QUINCKE BK (NEEDLE) ×1 IMPLANT
NEEDLE ASP BONE MRW 8GX15 (NEEDLE) ×2 IMPLANT
NEEDLE SPNL 18GX3.5 QUINCKE PK (NEEDLE) ×4 IMPLANT
NEEDLE SPNL 22GX3.5 QUINCKE BK (NEEDLE) IMPLANT
NS IRRIG 1000ML POUR BTL (IV SOLUTION) ×3 IMPLANT
OIL CARTRIDGE MAESTRO DRILL (MISCELLANEOUS) ×2
PACK LAMINECTOMY NEURO (CUSTOM PROCEDURE TRAY) ×2 IMPLANT
PAD ARMBOARD 7.5X6 YLW CONV (MISCELLANEOUS) ×6 IMPLANT
PATTIES SURGICAL .5 X.5 (GAUZE/BANDAGES/DRESSINGS) IMPLANT
PATTIES SURGICAL .5 X1 (DISPOSABLE) IMPLANT
PATTIES SURGICAL 1X1 (DISPOSABLE) IMPLANT
PROBE BALL TIP NVM5 SNG USE (BALLOONS) ×1 IMPLANT
ROD RELINE COCR LORD 5.0X25 (Rod) ×2 IMPLANT
SCREW LOCK RSS 4.5/5.0MM (Screw) ×4 IMPLANT
SCREW RMM POLY 5.5X30MM 4S (Screw) ×2 IMPLANT
SCREW SHANK RELINE MOD 5.5X35 (Screw) ×2 IMPLANT
SPACER 8X25X11 POST 4D LORDOT (Spacer) ×2 IMPLANT
SPONGE LAP 4X18 X RAY DECT (DISPOSABLE) IMPLANT
SPONGE NEURO XRAY DETECT 1X3 (DISPOSABLE) IMPLANT
SPONGE SURGIFOAM ABS GEL 100 (HEMOSTASIS) ×2 IMPLANT
STRIP BIOACTIVE VITOSS 25X100X (Neuro Prosthesis/Implant) ×1 IMPLANT
STRIP BIOACTIVE VITOSS 25X52X4 (Orthopedic Implant) ×1 IMPLANT
SUT VIC AB 1 CT1 18XBRD ANBCTR (SUTURE) ×2 IMPLANT
SUT VIC AB 1 CT1 8-18 (SUTURE) ×4
SUT VIC AB 2-0 CP2 18 (SUTURE) ×4 IMPLANT
SYR 3ML LL SCALE MARK (SYRINGE) IMPLANT
SYR CONTROL 10ML LL (SYRINGE) ×1 IMPLANT
TAPE CLOTH SURG 4X10 WHT LF (GAUZE/BANDAGES/DRESSINGS) ×1 IMPLANT
TOWEL GREEN STERILE (TOWEL DISPOSABLE) ×2 IMPLANT
TOWEL GREEN STERILE FF (TOWEL DISPOSABLE) ×2 IMPLANT
TRAY FOLEY W/METER SILVER 16FR (SET/KITS/TRAYS/PACK) ×2 IMPLANT
WATER STERILE IRR 1000ML POUR (IV SOLUTION) ×2 IMPLANT

## 2017-07-19 NOTE — Progress Notes (Signed)
Vitals:   07/19/17 1337 07/19/17 1356 07/19/17 1419 07/19/17 1543  BP: 98/64  95/66 (!) 98/53  Pulse: 90  85 78  Resp: 15  16 16   Temp:  97.9 F (36.6 C) 97.7 F (36.5 C) 97.6 F (36.4 C)  TempSrc:      SpO2: 98%  98% 100%  Weight:      Height:        Patient resting in bed, ambulated in the halls once, and her Foley catheter was removed. She then felt that she needs to use the bathroom, and when she got up with the staff, she had a presyncopal episode, and the staff returned to the bed. She was given a fluid bolus of 5 mL normal saline, and subsequent ate a sandwich, and is feeling better, and her blood pressure is improved. Her dressing is clean and dry. The nursing staff is monitoring her voiding function as well as her vital signs.  Symptomatically she is doing well she has minimal incisional discomfort, and the significant right lumbar radicular pain has been relieved.  Plan: Overall patient doing well. Continuing IV fluids, have encouraged patient to be a little bit more as tolerated. Ideally she'll ambulate another couple times this evening. Spoke with her husband after surgery, and with she and her husband now.  Hosie Spangle, MD 07/19/2017, 5:45 PM

## 2017-07-19 NOTE — Anesthesia Preprocedure Evaluation (Signed)
Anesthesia Evaluation  Patient identified by MRN, date of birth, ID band Patient awake    Reviewed: Allergy & Precautions, NPO status , Patient's Chart, lab work & pertinent test results  Airway Mallampati: I  TM Distance: >3 FB Neck ROM: Full    Dental   Pulmonary    Pulmonary exam normal        Cardiovascular Normal cardiovascular exam     Neuro/Psych    GI/Hepatic   Endo/Other    Renal/GU      Musculoskeletal   Abdominal   Peds  Hematology   Anesthesia Other Findings   Reproductive/Obstetrics                             Anesthesia Physical Anesthesia Plan  ASA: II  Anesthesia Plan: General   Post-op Pain Management:    Induction: Intravenous  PONV Risk Score and Plan: 3 and Dexamethasone, Ondansetron and Midazolam  Airway Management Planned: Oral ETT  Additional Equipment:   Intra-op Plan:   Post-operative Plan: Extubation in OR  Informed Consent: I have reviewed the patients History and Physical, chart, labs and discussed the procedure including the risks, benefits and alternatives for the proposed anesthesia with the patient or authorized representative who has indicated his/her understanding and acceptance.     Plan Discussed with: CRNA and Surgeon  Anesthesia Plan Comments:         Anesthesia Quick Evaluation

## 2017-07-19 NOTE — Anesthesia Procedure Notes (Signed)
Procedure Name: Intubation Date/Time: 07/19/2017 7:40 AM Performed by: Lieutenant Diego, CRNA Pre-anesthesia Checklist: Patient identified, Emergency Drugs available, Suction available and Patient being monitored Patient Re-evaluated:Patient Re-evaluated prior to induction Oxygen Delivery Method: Circle system utilized Preoxygenation: Pre-oxygenation with 100% oxygen Induction Type: IV induction Ventilation: Mask ventilation without difficulty Laryngoscope Size: Miller and 2 Grade View: Grade I Tube type: Oral Tube size: 7.0 mm Number of attempts: 1 Airway Equipment and Method: Stylet Placement Confirmation: ETT inserted through vocal cords under direct vision,  positive ETCO2 and breath sounds checked- equal and bilateral Secured at: 21 cm Tube secured with: Tape Dental Injury: Teeth and Oropharynx as per pre-operative assessment

## 2017-07-19 NOTE — H&P (Signed)
Subjective: Patient is a 62 y.o. right handed white female who is admitted for treatment of right lumbar radiculopathy secondary to a synovial cyst associated with a grade 1 dynamic degenerative spondylolisthesis.  Patient has had persistent and increasing difficulties with right lumbar radicular pain.  She has had transient relief with epidural steroid injections, but the pain has persisted, and she is admitted now for decompression and stabilization.Patient admitted for a bilateral L4-5 lumbar decompression including laminectomy, facetectomy, foraminotomy, and resection of synovial cyst, and bilateral L4-5 Stabilization via posterior lumbar interbody arthrodesis and posterior lateral arthrodesis with interbody implants, posterior instrumentation, and bone graft.   Past Medical History:  Diagnosis Date  . Arthritis   . Cataracts, both eyes   . Family history of adverse reaction to anesthesia    MOTHER HAD NAUSEA   . Headache    "YRS AGO"  . Heart murmur    WHILE PREGNANT THE FIRST TIME, WAS TOLD THEN SHE HAD MURMUR.  AFTER 2 OTHER PREG, NOTHING WAS SAID    Past Surgical History:  Procedure Laterality Date  . BREAST SURGERY    . EYE SURGERY    . FACIAL COSMETIC SURGERY     18 YRS AGO  . IMPLANTS  15 YRS AGO     BREAST  . LASIK     BOTH EYES - 18 YRS AGO  . TORN MENISCUS      REPAIRED AROUND 5 YRS AGO    Medications Prior to Admission  Medication Sig Dispense Refill Last Dose  . acetaminophen (TYLENOL) 500 MG tablet Take 1,000 mg by mouth 2 (two) times daily as needed for mild pain or moderate pain.   07/18/2017 at Unknown time  . Calcium-Vitamin D-Vitamin K [id number] MG-UNT-MCG CHEW Chew 1 each by mouth daily.   07/18/2017 at Unknown time  . Cholecalciferol (VITAMIN D3) 1000 units CAPS Take 1,000 Units by mouth daily.   07/18/2017 at Unknown time  . diclofenac (VOLTAREN) 75 MG EC tablet Take 75 mg by mouth 2 (two) times daily.   Past Week at Unknown time  . estradiol-levonorgestrel  (CLIMARA PRO) 0.045-0.015 MG/DAY Place 1 patch onto the skin once a week. Sundays   07/19/2017 at Unknown time  . Lifitegrast (XIIDRA OP) Place 1 drop into both eyes 2 (two) times daily.   07/19/2017 at Unknown time   Allergies  Allergen Reactions  . Sulfa Antibiotics Dermatitis and Rash    BLISTERS   . Ampicillin Rash    Has patient had a PCN reaction causing immediate rash, facial/tongue/throat swelling, SOB or lightheadedness with hypotension: No Has patient had a PCN reaction causing severe rash involving mucus membranes or skin necrosis: No Has patient had a PCN reaction that required hospitalization: No Has patient had a PCN reaction occurring within the last 10 years: No If all of the above answers are "NO", then may proceed with Cephalosporin use.   Marland Kitchen Penicillins Rash    Has patient had a PCN reaction causing immediate rash, facial/tongue/throat swelling, SOB or lightheadedness with hypotension: No Has patient had a PCN reaction causing severe rash involving mucus membranes or skin necrosis: No Has patient had a PCN reaction that required hospitalization: No Has patient had a PCN reaction occurring within the last 10 years: No If all of the above answers are "NO", then may proceed with Cephalosporin use.    Social History   Tobacco Use  . Smoking status: Never Smoker  . Smokeless tobacco: Never Used  Substance Use Topics  .  Alcohol use: Yes    Alcohol/week: 1.2 oz    Types: 2 Glasses of wine per week    History reviewed. No pertinent family history.   Review of Systems A comprehensive review of systems was negative.  Objective: Vital signs in last 24 hours: Temp:  [97.8 F (36.6 C)] 97.8 F (36.6 C) (01/03 0617) Pulse Rate:  [72] 72 (01/03 0617) Resp:  [19] 19 (01/03 0617) BP: (116)/(62) 116/62 (01/03 0617) SpO2:  [99 %] 99 % (01/03 0617)  EXAM: Patient is a well-developed well-nourished white female in no acute distress. Lungs are clear to auscultation , the  patient has symmetrical respiratory excursion. Heart has a regular rate and rhythm normal S1 and S2 no murmur.   Abdomen is soft nontender nondistended bowel sounds are present. Extremity examination shows no clubbing cyanosis or edema. Motor examination shows 5 over 5 strength in the lower extremities including the iliopsoas quadriceps dorsiflexor extensor hallicus  longus and plantar flexor bilaterally. Sensation is intact to pinprick in the distal lower extremities. Reflexes are symmetrical bilaterally. No pathologic reflexes are present. Patient has a normal gait and stance.   Data Review:CBC    Component Value Date/Time   WBC 5.9 07/16/2017 1112   RBC 4.02 07/16/2017 1112   HGB 12.8 07/16/2017 1112   HCT 39.0 07/16/2017 1112   PLT 237 07/16/2017 1112   MCV 97.0 07/16/2017 1112   MCH 31.8 07/16/2017 1112   MCHC 32.8 07/16/2017 1112   RDW 13.6 07/16/2017 1112                          BMET    Component Value Date/Time   NA 135 07/16/2017 1112   K 4.0 07/16/2017 1112   CL 101 07/16/2017 1112   CO2 25 07/16/2017 1112   GLUCOSE 90 07/16/2017 1112   BUN 13 07/16/2017 1112   CREATININE 0.69 07/16/2017 1112   CALCIUM 9.8 07/16/2017 1112   GFRNONAA >60 07/16/2017 1112   GFRAA >60 07/16/2017 1112     Assessment/Plan: Patient with right lumbar radiculopathy secondary to a right L4-5 synovial cyst, associated with a dynamic degenerative L4-5 spondylolisthesis who is admitted now for an L4-5 lumbar decompression and arthrodesis.  I've discussed with the patient the nature of his condition, the nature the surgical procedure, the typical length of surgery, hospital stay, and overall recuperation, the limitations postoperatively, and risks of surgery. I discussed risks including risks of infection, bleeding, possibly need for transfusion, the risk of nerve root dysfunction with pain, weakness, numbness, or paresthesias, the risk of dural tear and CSF leakage and possible need for further  surgery, the risk of failure of the arthrodesis and possibly for further surgery, the risk of anesthetic complications including myocardial infarction, stroke, pneumonia, and death. We discussed the need for postoperative immobilization in a lumbar brace. Understanding all this the patient does wish to proceed with surgery and is admitted for such.     Hosie Spangle, MD 07/19/2017 7:18 AM

## 2017-07-19 NOTE — Op Note (Signed)
07/19/2017  12:41 PM  PATIENT:  Heidi Skinner  62 y.o. female  PRE-OPERATIVE DIAGNOSIS: Right L4-5 synovial cyst, grade 1 dynamic degenerative spondylolisthesis L4-5, lumbar stenosis, lumbar spondylosis, lumbar degenerative disease, right lumbar radiculopathy  POST-OPERATIVE DIAGNOSIS:  Right L4-5 synovial cyst, grade 1 dynamic degenerative spondylolisthesis L4-5, lumbar stenosis, lumbar spondylosis, lumbar degenerative disease, right lumbar radiculopathy  PROCEDURE:  Procedure(s): Bilateral L4-5 lumbar decompression including bilateral laminectomy, facetectomy, and foraminotomies for decompression of the stenotic compression of the exiting L4 and L5 nerve roots bilaterally, with resection of right L4-5 synovial cyst, with decompression beyond the required for interbody arthrodesis; bilateral L4-5 posterior lumbar interbody arthrodesis with AVS peek interbody implants, Vitoss BA with bone marrow aspirate, and infuse; bilateral L4-5 posterior lateral arthrodesis with Nuvasive Reline nonsegmental posterior instrumentation, Vitoss BA with bone marrow aspirate, and infuse  SURGEON: Jovita Gamma, MD  ASSISTANTS:  Consuella Lose, MD  ANESTHESIA:   general  EBL:  Total I/O In: 1500 [I.V.:1500] Out: 500 [Urine:400; Blood:100]  BLOOD ADMINISTERED:none  CELL SAVER GIVEN: Cell Saver technician felt that there was insufficient blood loss processed the collected blood  COUNT:  Correct per nursing staff  DICTATION: Patient is brought to the operating room placed under general endotracheal anesthesia. The patient was turned to prone position the lumbar region was prepped with Betadine soap and solution and draped in a sterile fashion. The midline was infiltrated with local anesthesia with epinephrine.  The C arm was used to localize the L4-5 level.  A midline incision was made carried down through the subcutaneous tissue, bipolar cautery and electrocautery were used to maintain hemostasis.  Dissection was carried down to the lumbar fascia. The fascia was incised bilaterally and the paraspinal muscles were dissected with a spinous process and lamina in a subperiosteal fashion.  C arm again was used to localize the L4-5 interlaminar space bilaterally. Dissection was then carried out laterally over the facet complex and the transverse processes of L4 and L5 were exposed and decorticated.  Then with C-arm fluoroscopic guidance and electrophysiologic monitoring we identified entry points for placement of cortical pedicle shanks bilaterally at L4.  High-speed drill was used to start pilot holes, and then each of the pedicles was drilled in a inferomedial to superolateral trajectory with continued C-arm imaging and physiologic monitoring.  Each screw hole was examined with a ball probe and good bony surfaces were found.  They were tapped with a 5.0 mm tap, and 5.5 x 48mm shanks were placed bilaterally.  We then proceeded with the decompression.  Bilateral L4-5 laminectomy was performed using the high-speed drill and Kerrison punches. Dissection was carried out laterally including facetectomy and foraminotomies with decompression of the stenotic compression of the L4 and L5 nerve roots.  Particular care was taken to mobilize and remove the synovial cyst on the right side at L4-5.  In the end good decompression of the thecal sac and exiting nerve roots was achieved.    Once the decompression stenotic compression of the thecal sac and exiting nerve roots was completed we proceeded with the posterior lumbar interbody arthrodesis. The annulus was incised bilaterally and the disc space entered. A thorough discectomy was performed using pituitary rongeurs and curettes. Once the discectomy was completed we began to prepare the endplate surfaces removing the cartilaginous endplates surface. We then measured the height of the intervertebral disc space. We selected 11 x 25 x 4 AVS peek interbody implants.  We  then identified pedicle entry points bilaterally at L5.  A  high-speed drill was used to start pilot holes, and with C-arm fluoroscopic guidance and electrophysiologic monitoring each of the pedicles was drilled in a medial to lateral trajectory.  Bone marrow aspirate was aspirated from the vertebral body and injected over a 10 cc and a 5 cc strip of Vitoss BA. Then each of the pedicles was examined with the ball probe. Each of the pedicles was then tapped with a 5.0 mm tap, again examined with the ball probe, good threading was found. We then placed 5.0 x 30 mm screws bilaterally.    We then packed the AVS peek interbody implants with Vitoss BA with bone marrow aspirate and infuse, and then placed the first implant and on the right side, carefully retracting the thecal sac and nerve root medially. We then went back to the left side and packed the midline with additional Vitoss BA with bone marrow aspirate and infuse, and then placed a second implant and on the left side again retracting the thecal sac and nerve root medially. Additional Vitoss BA with bone marrow aspirate was packed lateral to the implants.  We then packed the lateral gutter over the transverse processes and intertransverse space with Vitoss BA with bone marrow aspirate and infuse.  Heads were then secured to each of the shanks at L4.  We then selected pre-lordosis 25 mm rods, they were placed within the screw heads and secured with locking caps once all 4 locking caps were placed final tightening was performed against a counter torque.  The wound had been irrigated multiple times during the procedure with saline solution and bacitracin solution, good hemostasis was established with a combination of bipolar cautery and Gelfoam with thrombin. Once good hemostasis was confirmed we proceeded with closure paraspinal muscles deep fascia and Scarpa's fascia were closed with interrupted undyed 1 Vicryl sutures the subcutaneous and subcuticular closed  with interrupted inverted 2-0 undyed Vicryl sutures the skin edges were approximated with Dermabond.  Wound was dressed with sterile gauze and Hypafix.  Following surgery the patient was turned back to the supine position to be reversed and the anesthetic extubated and transferred to the recovery room for further care.  PLAN OF CARE: Admit to inpatient   PATIENT DISPOSITION:  PACU - hemodynamically stable.   Delay start of Pharmacological VTE agent (>24hrs) due to surgical blood loss or risk of bleeding:  yes

## 2017-07-19 NOTE — Anesthesia Postprocedure Evaluation (Signed)
Anesthesia Post Note  Patient: Heidi Skinner  Procedure(s) Performed: LUMBAR FOUR- LUMBAR FIVE DECOMPRESSION,POSTERIOR LUMBAR INTERBODY FUSION, POSTERIOR LATERAL ARTHRODESIS (N/A Spine Lumbar)     Patient location during evaluation: PACU Anesthesia Type: General Level of consciousness: awake and alert Pain management: pain level controlled Vital Signs Assessment: post-procedure vital signs reviewed and stable Respiratory status: spontaneous breathing, nonlabored ventilation, respiratory function stable and patient connected to nasal cannula oxygen Cardiovascular status: blood pressure returned to baseline and stable Postop Assessment: no apparent nausea or vomiting Anesthetic complications: no    Last Vitals:  Vitals:   07/19/17 1307 07/19/17 1315  BP: 113/68   Pulse: (!) 104 (!) 103  Resp: 13 16  Temp:    SpO2: 99% 98%    Last Pain:  Vitals:   07/19/17 1250  TempSrc:   PainSc: Asleep                 Manasi Dishon DAVID

## 2017-07-19 NOTE — Transfer of Care (Signed)
Immediate Anesthesia Transfer of Care Note  Patient: Heidi Skinner  Procedure(s) Performed: LUMBAR FOUR- LUMBAR FIVE DECOMPRESSION,POSTERIOR LUMBAR INTERBODY FUSION, POSTERIOR LATERAL ARTHRODESIS (N/A Spine Lumbar)  Patient Location: PACU  Anesthesia Type:General  Level of Consciousness: sedated  Airway & Oxygen Therapy: Patient Spontanous Breathing and Patient connected to face mask oxygen  Post-op Assessment: Report given to RN and Post -op Vital signs reviewed and stable  Post vital signs: Reviewed and stable  Last Vitals:  Vitals:   07/19/17 0617  BP: 116/62  Pulse: 72  Resp: 19  Temp: 36.6 C  SpO2: 99%    Last Pain:  Vitals:   07/19/17 0617  TempSrc: Oral  PainSc:          Complications: No apparent anesthesia complications

## 2017-07-20 MED ORDER — HYDROCODONE-ACETAMINOPHEN 5-325 MG PO TABS: 1.0000 | ORAL_TABLET | ORAL | 0 refills | Status: DC | PRN

## 2017-07-20 NOTE — Discharge Summary (Signed)
Physician Discharge Summary  Patient ID: Heidi Skinner MRN: [id number] DOB/AGE: December 23, 1955 62 y.o.  Admit date: 07/19/2017 Discharge date: 07/20/2017  Admission Diagnoses:  Right L4-5 synovial cyst, grade 1 dynamic degenerative spondylolisthesis L4-5, lumbar stenosis, lumbar spondylosis, lumbar degenerative disease, right lumbar radiculopathy  Discharge Diagnoses:  Right L4-5 synovial cyst, grade 1 dynamic degenerative spondylolisthesis L4-5, lumbar stenosis, lumbar spondylosis, lumbar degenerative disease, right lumbar radiculopathy  Active Problems:   Synovial cyst of lumbar spine   Discharged Condition: good  Hospital Course: She was admitted, underwent an L4-5 lumbar decompression and arthrodesis. Postoperatively she has done well. She is up and ambulate actively. She is voiding well. Her dressing was removed, and the incision is healing nicely. There is no erythema, swelling, or drainage. She is being discharged home with instructions regarding wound care and activities. She is scheduled to return for follow-up with me in 3 weeks.  Discharge Exam: Blood pressure (!) 97/55, pulse 72, temperature 98.3 F (36.8 C), resp. rate 16, height 5' 4.49" (1.638 m), weight 57.6 kg (127 lb), SpO2 100 %.  Disposition:  Home  Discharge Instructions    Discharge wound care:   Complete by:  As directed    Leave the wound open to air. Shower daily with the wound uncovered. Water and soapy water should run over the incision area. Do not wash directly on the incision for 2 weeks. Remove the glue after 2 weeks.   Driving Restrictions   Complete by:  As directed    No driving for 2 weeks. May ride in the car locally now. May begin to drive locally in 2 weeks.   Other Restrictions   Complete by:  As directed    Walk gradually increasing distances out in the fresh air at least twice a day. Walking additional 6 times inside the house, gradually increasing distances, daily. No bending, lifting, or  twisting. Perform activities between shoulder and waist height (that is at counter height when standing or table height when sitting).     Allergies as of 07/20/2017      Reactions   Sulfa Antibiotics Dermatitis, Rash   BLISTERS   Ampicillin Rash   Has patient had a PCN reaction causing immediate rash, facial/tongue/throat swelling, SOB or lightheadedness with hypotension: No Has patient had a PCN reaction causing severe rash involving mucus membranes or skin necrosis: No Has patient had a PCN reaction that required hospitalization: No Has patient had a PCN reaction occurring within the last 10 years: No If all of the above answers are "NO", then may proceed with Cephalosporin use.   Penicillins Rash   Has patient had a PCN reaction causing immediate rash, facial/tongue/throat swelling, SOB or lightheadedness with hypotension: No Has patient had a PCN reaction causing severe rash involving mucus membranes or skin necrosis: No Has patient had a PCN reaction that required hospitalization: No Has patient had a PCN reaction occurring within the last 10 years: No If all of the above answers are "NO", then may proceed with Cephalosporin use.      Medication List    TAKE these medications   acetaminophen 500 MG tablet Commonly known as:  TYLENOL Take 1,000 mg by mouth 2 (two) times daily as needed for mild pain or moderate pain.   Calcium-Vitamin D-Vitamin K 440-347-42 MG-UNT-MCG Chew Chew 1 each by mouth daily.   CLIMARA PRO 0.045-0.015 MG/DAY Generic drug:  estradiol-levonorgestrel Place 1 patch onto the skin once a week. Sundays   diclofenac 75 MG EC tablet  Commonly known as:  VOLTAREN Take 75 mg by mouth 2 (two) times daily.   HYDROcodone-acetaminophen 5-325 MG tablet Commonly known as:  NORCO/VICODIN Take 1-2 tablets by mouth every 4 (four) hours as needed (pain).   Vitamin D3 1000 units Caps Take 1,000 Units by mouth daily.   Shirley Friar OP Place 1 drop into both eyes 2 (two)  times daily.            Discharge Care Instructions  (From admission, onward)        Start     Ordered   07/20/17 0000  Discharge wound care:    Comments:  Leave the wound open to air. Shower daily with the wound uncovered. Water and soapy water should run over the incision area. Do not wash directly on the incision for 2 weeks. Remove the glue after 2 weeks.   07/20/17 0850       Signed: Hosie Spangle 07/20/2017, 8:50 AM

## 2017-07-20 NOTE — Progress Notes (Signed)
Patient alert and oriented, mae's well, voiding adequate amount of urine, swallowing without difficulty, no c/o pain at time of discharge. Patient discharged home with family. Script and discharged instructions given to patient. Patient and family stated understanding of instructions given. Patient has an appointment with Dr. Nudelman 

## 2017-07-20 NOTE — Discharge Instructions (Signed)

## 2017-07-25 MED FILL — Heparin Sodium (Porcine) Inj 1000 Unit/ML: INTRAMUSCULAR | Qty: 30 | Status: AC

## 2017-07-25 MED FILL — Sodium Chloride IV Soln 0.9%: INTRAVENOUS | Qty: 1000 | Status: AC

## 2017-08-10 DIAGNOSIS — M4316 Spondylolisthesis, lumbar region: Secondary | ICD-10-CM | POA: Diagnosis not present

## 2017-09-04 DIAGNOSIS — M25562 Pain in left knee: Secondary | ICD-10-CM | POA: Diagnosis not present

## 2017-09-04 DIAGNOSIS — R490 Dysphonia: Secondary | ICD-10-CM | POA: Diagnosis not present

## 2017-09-04 DIAGNOSIS — Z0000 Encounter for general adult medical examination without abnormal findings: Secondary | ICD-10-CM | POA: Diagnosis not present

## 2017-09-04 DIAGNOSIS — M8580 Other specified disorders of bone density and structure, unspecified site: Secondary | ICD-10-CM | POA: Diagnosis not present

## 2017-09-27 DIAGNOSIS — S83242A Other tear of medial meniscus, current injury, left knee, initial encounter: Secondary | ICD-10-CM | POA: Diagnosis not present

## 2017-10-04 ENCOUNTER — Other Ambulatory Visit: Payer: Self-pay | Admitting: Orthopedic Surgery

## 2017-10-04 DIAGNOSIS — M25562 Pain in left knee: Secondary | ICD-10-CM

## 2017-10-09 ENCOUNTER — Ambulatory Visit
Admission: RE | Admit: 2017-10-09 | Discharge: 2017-10-09 | Disposition: A | Discharge status: Home / Self Care | Payer: 59 | Source: Ambulatory Visit | Attending: Orthopedic Surgery | Admitting: Orthopedic Surgery

## 2017-10-09 DIAGNOSIS — M25562 Pain in left knee: Secondary | ICD-10-CM

## 2017-10-09 DIAGNOSIS — Z981 Arthrodesis status: Secondary | ICD-10-CM | POA: Diagnosis not present

## 2017-10-11 DIAGNOSIS — H35373 Puckering of macula, bilateral: Secondary | ICD-10-CM | POA: Diagnosis not present

## 2017-10-11 DIAGNOSIS — H40023 Open angle with borderline findings, high risk, bilateral: Secondary | ICD-10-CM | POA: Diagnosis not present

## 2017-10-11 DIAGNOSIS — H43813 Vitreous degeneration, bilateral: Secondary | ICD-10-CM | POA: Diagnosis not present

## 2017-10-25 DIAGNOSIS — S83242D Other tear of medial meniscus, current injury, left knee, subsequent encounter: Secondary | ICD-10-CM | POA: Diagnosis not present

## 2017-11-08 DIAGNOSIS — Z1211 Encounter for screening for malignant neoplasm of colon: Secondary | ICD-10-CM | POA: Diagnosis not present

## 2017-11-08 DIAGNOSIS — K648 Other hemorrhoids: Secondary | ICD-10-CM | POA: Diagnosis not present

## 2017-12-07 DIAGNOSIS — L720 Epidermal cyst: Secondary | ICD-10-CM | POA: Diagnosis not present

## 2017-12-07 DIAGNOSIS — L723 Sebaceous cyst: Secondary | ICD-10-CM | POA: Diagnosis not present

## 2017-12-07 DIAGNOSIS — L821 Other seborrheic keratosis: Secondary | ICD-10-CM | POA: Diagnosis not present

## 2017-12-25 DIAGNOSIS — S83242D Other tear of medial meniscus, current injury, left knee, subsequent encounter: Secondary | ICD-10-CM | POA: Diagnosis not present

## 2018-01-15 DIAGNOSIS — S83242D Other tear of medial meniscus, current injury, left knee, subsequent encounter: Secondary | ICD-10-CM | POA: Diagnosis not present

## 2018-03-01 DIAGNOSIS — M4316 Spondylolisthesis, lumbar region: Secondary | ICD-10-CM | POA: Diagnosis not present

## 2018-03-01 DIAGNOSIS — Z981 Arthrodesis status: Secondary | ICD-10-CM | POA: Diagnosis not present

## 2018-03-01 DIAGNOSIS — M4156 Other secondary scoliosis, lumbar region: Secondary | ICD-10-CM | POA: Diagnosis not present

## 2018-03-19 DIAGNOSIS — S83242D Other tear of medial meniscus, current injury, left knee, subsequent encounter: Secondary | ICD-10-CM | POA: Diagnosis not present

## 2018-04-17 ENCOUNTER — Encounter: Payer: Self-pay | Admitting: Obstetrics and Gynecology

## 2018-04-30 DIAGNOSIS — S83242A Other tear of medial meniscus, current injury, left knee, initial encounter: Secondary | ICD-10-CM | POA: Diagnosis not present

## 2018-04-30 DIAGNOSIS — M94262 Chondromalacia, left knee: Secondary | ICD-10-CM | POA: Diagnosis not present

## 2018-04-30 DIAGNOSIS — S83282A Other tear of lateral meniscus, current injury, left knee, initial encounter: Secondary | ICD-10-CM | POA: Diagnosis not present

## 2018-04-30 DIAGNOSIS — G8918 Other acute postprocedural pain: Secondary | ICD-10-CM | POA: Diagnosis not present

## 2018-05-08 ENCOUNTER — Other Ambulatory Visit (HOSPITAL_COMMUNITY)
Admission: RE | Admit: 2018-05-08 | Discharge: 2018-05-08 | Disposition: A | Discharge status: Home / Self Care | Payer: 59 | Source: Ambulatory Visit | Attending: Obstetrics and Gynecology | Admitting: Obstetrics and Gynecology

## 2018-05-08 ENCOUNTER — Other Ambulatory Visit: Payer: Self-pay

## 2018-05-08 ENCOUNTER — Encounter: Payer: Self-pay | Admitting: Obstetrics and Gynecology

## 2018-05-08 ENCOUNTER — Ambulatory Visit: Payer: 59 | Admitting: Obstetrics and Gynecology

## 2018-05-08 VITALS — BP 110/66 | HR 68 | Resp 16 | Ht 64.0 in | Wt 126.4 lb

## 2018-05-08 DIAGNOSIS — M8580 Other specified disorders of bone density and structure, unspecified site: Secondary | ICD-10-CM

## 2018-05-08 DIAGNOSIS — Z780 Asymptomatic menopausal state: Secondary | ICD-10-CM | POA: Diagnosis not present

## 2018-05-08 DIAGNOSIS — Z01419 Encounter for gynecological examination (general) (routine) without abnormal findings: Secondary | ICD-10-CM

## 2018-05-08 NOTE — Patient Instructions (Signed)

## 2018-05-08 NOTE — Progress Notes (Signed)
62 y.o. G3P3 Married Caucasian female here for annual exam.   Former patient of Dr. Carren Rang.   On HRT.  Tried to wean off HRT in the past and had hot flashes and vaginal dryness when she did this.   Had spotting one month before her annual exam last year at her prior GYN office.  She had a recommendation to do an evaluation, and she received a second opinion from her retired GYN Dr. Carren Rang.  She did not change her HRT patch regularly at that time, and patient states Dr. Carren Rang recommended she be very regimented with this and have evaluation if the bleeding continued.  The bleeding stopped and has not recurred. No evaluation was done.   Notices a growth in her groin for 4 months.  Not painful.  Had a problem with UTIs with diaphragm use.   Taking vit D 4000 IU daily.   3 adult children.  Is a Chief Executive Officer.   PCP: Lavone Orn, MD    Patient's last menstrual period was 07/17/2008 (approximate).           Sexually active: Yes.    The current method of family planning is post menopausal status.    Exercising: Yes.    Eliptical, walking biking and weights Smoker:  no  Health Maintenance: Pap:  04/2017 normal per patient, 04-22-14 Neg.   History of abnormal Pap:  Yes, Hx cryotherapy to cervix years ago MMG:  04/2017 normal per patient ([facility]) Colonoscopy:  2019 normal Dr.Medoff;next 5 years) BMD:   2017/2018  Result  Osteopenia TDaP: Unsure Gardasil:   no HIV:no Hep C:no Screening Labs:   ---   reports that she has never smoked. She has never used smokeless tobacco. She reports that she drinks about 1.0 standard drinks of alcohol per week. She reports that she does not use drugs.  Past Medical History:  Diagnosis Date  . Arthritis   . Cataracts, both eyes   . Family history of adverse reaction to anesthesia    MOTHER HAD NAUSEA   . Headache    "YRS AGO"  . Heart murmur    WHILE PREGNANT THE FIRST TIME, WAS TOLD THEN SHE HAD MURMUR.  AFTER 2 OTHER PREG, NOTHING WAS  SAID  . Osteopenia     Past Surgical History:  Procedure Laterality Date  . BACK SURGERY  07/19/2017   synovial cyst removal and spinal fusion  . BREAST SURGERY    . EYE SURGERY    . FACIAL COSMETIC SURGERY     18 YRS AGO  . IMPLANTS  15 YRS AGO     BREAST  . LASIK     BOTH EYES - 18 YRS AGO  . TORN MENISCUS      REPAIRED AROUND 5 YRS AGO    Current Outpatient Medications  Medication Sig Dispense Refill  . acetaminophen (TYLENOL) 500 MG tablet Take 1,000 mg by mouth 2 (two) times daily as needed for mild pain or moderate pain.    . Calcium-Vitamin D-Vitamin K 500-100-40 MG-UNT-MCG CHEW Chew 1 each by mouth daily.    . Cholecalciferol (VITAMIN D3) 1000 units CAPS Take 1,000 Units by mouth daily.    Marland Kitchen estradiol-levonorgestrel (CLIMARA PRO) 0.045-0.015 MG/DAY Place 1 patch onto the skin once a week. Sundays    . Glucosamine 500 MG CAPS Take 1 capsule by mouth daily.    Marland Kitchen Lifitegrast (XIIDRA OP) Place 1 drop into both eyes 2 (two) times daily.    . TURMERIC PO  Take 1 tablet by mouth daily.     No current facility-administered medications for this visit.     Family History  Problem Relation Age of Onset  . Crohn's disease Mother   . Breast cancer Mother 66  . Hypertension Mother   . Other Father        vasovagal syncope  . Cancer Maternal Grandmother 1       ?unsure if ovarian or cervical ca    Review of Systems  All other systems reviewed and are negative.   Exam:   BP 110/66 (BP Location: Right Arm, Patient Position: Sitting, Cuff Size: Normal)   Pulse 68   Resp 16   Ht 5\' 4"  (1.626 m)   Wt 126 lb 6.4 oz (57.3 kg)   LMP 07/17/2008 (Approximate)   BMI 21.70 kg/m     General appearance: alert, cooperative and appears stated age Head: Normocephalic, without obvious abnormality, atraumatic Neck: no adenopathy, supple, symmetrical, trachea midline and thyroid normal to inspection and palpation Lungs: clear to auscultation bilaterally Breasts: bilateral implants,  no masses or tenderness, No nipple retraction or dimpling, No nipple discharge or bleeding, No axillary or supraclavicular adenopathy Heart: regular rate and rhythm Abdomen: soft, non-tender; no masses, no organomegaly Extremities: extremities normal, atraumatic, no cyanosis or edema Skin: Skin color, texture, turgor normal. No rashes or lesions Lymph nodes: Cervical, supraclavicular, and axillary nodes normal. No abnormal inguinal nodes palpated Neurologic: Grossly normal  Pelvic: External genitalia: 6 - 7 x 3 mm sebaceous cyst left mons.               Urethra:  normal appearing urethra with no masses, tenderness or lesions              Bartholins and Skenes: normal                 Vagina: normal appearing vagina with normal color and discharge, no lesions              Cervix: no lesions              Pap taken: Yes.   Bimanual Exam:  Uterus:  normal size, contour, position, consistency, mobility, non-tender              Adnexa: no mass, fullness, tenderness              Rectal exam: Yes.  .  Confirms.              Anus:  normal sphincter tone, no lesions  Chaperone was present for exam.  Assessment:   Well woman visit with normal exam. Remote hx of cryotherapy for abnormal pap.  Osteopenia.  HRT patient.  Has bilateral implants.  FH breast cancer.  Mother in her 37s.  MGM with hx of reproductive cancer, possible ovarian cancer.  Hx back surgery.   Plan: Mammogram screening.  Recommended self breast awareness. Pap and HR HPV as above. Guidelines for Calcium, Vitamin D, regular exercise program including cardiovascular and weight bearing exercise. HRT.  Will wean down and cut her patch in half.  We discussed the WHI and risks of breast cancer, DVT, PE, and stroke.  We also discussed the limitations of this study.  She will contact me if she needs local vaginal estrogen or if not doing well with reducing her HRT.  Routine labs.  BMD ordered. Follow up annually and prn.    After visit summary provided.

## 2018-05-10 LAB — CBC
HEMATOCRIT: 39.5 % (ref 34.0–46.6)
HEMOGLOBIN: 13.4 g/dL (ref 11.1–15.9)
MCH: 31.3 pg (ref 26.6–33.0)
MCHC: 33.9 g/dL (ref 31.5–35.7)
MCV: 92 fL (ref 79–97)
PLATELETS: 276 10*3/uL (ref 150–450)
RBC: 4.28 x10E6/uL (ref 3.77–5.28)
RDW: 12.8 % (ref 12.3–15.4)
WBC: 7.5 10*3/uL (ref 3.4–10.8)

## 2018-05-10 LAB — COMPREHENSIVE METABOLIC PANEL
A/G RATIO: 1.9 (ref 1.2–2.2)
ALBUMIN: 4.7 g/dL (ref 3.6–4.8)
ALT: 12 IU/L (ref 0–32)
AST: 17 IU/L (ref 0–40)
Alkaline Phosphatase: 82 IU/L (ref 39–117)
BUN / CREAT RATIO: 24 (ref 12–28)
BUN: 16 mg/dL (ref 8–27)
Bilirubin Total: 0.3 mg/dL (ref 0.0–1.2)
CALCIUM: 9.9 mg/dL (ref 8.7–10.3)
CHLORIDE: 97 mmol/L (ref 96–106)
CO2: 26 mmol/L (ref 20–29)
Creatinine, Ser: 0.68 mg/dL (ref 0.57–1.00)
GFR calc Af Amer: 108 mL/min/{1.73_m2} (ref >59)
GFR calc non Af Amer: 94 mL/min/{1.73_m2} (ref >59)
GLOBULIN, TOTAL: 2.5 g/dL (ref 1.5–4.5)
Glucose: 90 mg/dL (ref 65–99)
POTASSIUM: 3.9 mmol/L (ref 3.5–5.2)
SODIUM: 139 mmol/L (ref 134–144)
TOTAL PROTEIN: 7.2 g/dL (ref 6.0–8.5)

## 2018-05-10 LAB — LIPID PANEL
CHOL/HDL RATIO: 3.7 ratio (ref 0.0–4.4)
CHOLESTEROL TOTAL: 246 mg/dL — AB (ref 100–199)
HDL: 67 mg/dL (ref >39)
LDL Calculated: 138 mg/dL — ABNORMAL HIGH (ref 0–99)
TRIGLYCERIDES: 204 mg/dL — AB (ref 0–149)
VLDL Cholesterol Cal: 41 mg/dL — ABNORMAL HIGH (ref 5–40)

## 2018-05-10 LAB — TSH: TSH: 1.49 u[IU]/mL (ref 0.450–4.500)

## 2018-05-10 LAB — VITAMIN D 25 HYDROXY (VIT D DEFICIENCY, FRACTURES): VIT D 25 HYDROXY: 55.4 ng/mL (ref 30.0–100.0)

## 2018-05-13 LAB — CYTOLOGY - PAP
Diagnosis: NEGATIVE
HPV (WINDOPATH): NOT DETECTED

## 2018-05-20 ENCOUNTER — Other Ambulatory Visit: Payer: Self-pay | Admitting: Obstetrics and Gynecology

## 2018-05-20 DIAGNOSIS — Z1231 Encounter for screening mammogram for malignant neoplasm of breast: Secondary | ICD-10-CM

## 2018-05-21 DIAGNOSIS — E7800 Pure hypercholesterolemia, unspecified: Secondary | ICD-10-CM | POA: Diagnosis not present

## 2018-06-03 DIAGNOSIS — H18413 Arcus senilis, bilateral: Secondary | ICD-10-CM | POA: Diagnosis not present

## 2018-06-03 DIAGNOSIS — H16223 Keratoconjunctivitis sicca, not specified as Sjogren's, bilateral: Secondary | ICD-10-CM | POA: Diagnosis not present

## 2018-06-03 DIAGNOSIS — H18001 Unspecified corneal deposit, right eye: Secondary | ICD-10-CM | POA: Diagnosis not present

## 2018-07-18 DIAGNOSIS — S83282D Other tear of lateral meniscus, current injury, left knee, subsequent encounter: Secondary | ICD-10-CM | POA: Diagnosis not present

## 2018-07-18 DIAGNOSIS — S83242D Other tear of medial meniscus, current injury, left knee, subsequent encounter: Secondary | ICD-10-CM | POA: Diagnosis not present

## 2018-07-25 ENCOUNTER — Ambulatory Visit
Admission: RE | Admit: 2018-07-25 | Discharge: 2018-07-25 | Disposition: A | Discharge status: Home / Self Care | Payer: 59 | Source: Ambulatory Visit | Attending: Obstetrics and Gynecology | Admitting: Obstetrics and Gynecology

## 2018-07-25 DIAGNOSIS — M8580 Other specified disorders of bone density and structure, unspecified site: Secondary | ICD-10-CM

## 2018-07-25 DIAGNOSIS — Z780 Asymptomatic menopausal state: Secondary | ICD-10-CM | POA: Diagnosis not present

## 2018-07-25 DIAGNOSIS — M8589 Other specified disorders of bone density and structure, multiple sites: Secondary | ICD-10-CM | POA: Diagnosis not present

## 2018-07-25 DIAGNOSIS — Z1231 Encounter for screening mammogram for malignant neoplasm of breast: Secondary | ICD-10-CM

## 2018-07-31 ENCOUNTER — Telehealth: Payer: Self-pay | Admitting: Obstetrics and Gynecology

## 2018-07-31 MED ORDER — ESTRADIOL-LEVONORGESTREL 0.045-0.015 MG/DAY TD PTWK: 1.0000 | MEDICATED_PATCH | TRANSDERMAL | 5 refills | Status: DC

## 2018-07-31 NOTE — Telephone Encounter (Signed)
Left message to call Leyna Vanderkolk, RN at [facility] [phone].   

## 2018-07-31 NOTE — Telephone Encounter (Signed)
Patient would like "go back to using a full patch instead of a half patch". She askerd to talk with Dr.Silva about making this change in her medication.

## 2018-07-31 NOTE — Telephone Encounter (Signed)
[facility] for refill on Climara Pro weekly for the next 6 months.

## 2018-07-31 NOTE — Telephone Encounter (Signed)
Spoke with patient. Patient states she has tried 1/2 of climara pro patch weekly as discussed at last AEX on 04/2018. Patient reports increased hot flashes, waking up several times during the night and increased urinary frequency during the night. Denies any other GYN or urinary symptoms. Patient is requesting to go back to Climara Pro patch, 1 patch weekly for the next 6 months. Patient will need new Rx to pharmacy on file. Advised I will review with Dr. Quincy Simmonds and return call with recommendations.   Last AEX 05/08/18 Next AEX 06/06/19 Last MMG 07/25/18: Birads 1, neg  Dr. Quincy Simmonds -please advise on Climara Pro RX.

## 2018-07-31 NOTE — Telephone Encounter (Signed)
Left detailed message, ok per dpr. Advised refill of Climara Pro weekly patch #4/5RF to [facility] as requested. Return call to office if any additional questions/concerns.   Encounter closed.

## 2018-08-07 DIAGNOSIS — J069 Acute upper respiratory infection, unspecified: Secondary | ICD-10-CM | POA: Diagnosis not present

## 2018-09-05 DIAGNOSIS — S83282D Other tear of lateral meniscus, current injury, left knee, subsequent encounter: Secondary | ICD-10-CM | POA: Diagnosis not present

## 2018-09-05 DIAGNOSIS — S83242D Other tear of medial meniscus, current injury, left knee, subsequent encounter: Secondary | ICD-10-CM | POA: Diagnosis not present

## 2018-11-07 DIAGNOSIS — H20023 Recurrent acute iridocyclitis, bilateral: Secondary | ICD-10-CM | POA: Diagnosis not present

## 2018-11-07 DIAGNOSIS — H16223 Keratoconjunctivitis sicca, not specified as Sjogren's, bilateral: Secondary | ICD-10-CM | POA: Diagnosis not present

## 2018-11-07 DIAGNOSIS — H04123 Dry eye syndrome of bilateral lacrimal glands: Secondary | ICD-10-CM | POA: Diagnosis not present

## 2018-11-11 DIAGNOSIS — H04123 Dry eye syndrome of bilateral lacrimal glands: Secondary | ICD-10-CM | POA: Diagnosis not present

## 2018-11-11 DIAGNOSIS — H20023 Recurrent acute iridocyclitis, bilateral: Secondary | ICD-10-CM | POA: Diagnosis not present

## 2018-11-11 DIAGNOSIS — H35373 Puckering of macula, bilateral: Secondary | ICD-10-CM | POA: Diagnosis not present

## 2018-11-11 DIAGNOSIS — H16223 Keratoconjunctivitis sicca, not specified as Sjogren's, bilateral: Secondary | ICD-10-CM | POA: Diagnosis not present

## 2019-01-07 DIAGNOSIS — M5136 Other intervertebral disc degeneration, lumbar region: Secondary | ICD-10-CM | POA: Diagnosis not present

## 2019-01-07 DIAGNOSIS — M4156 Other secondary scoliosis, lumbar region: Secondary | ICD-10-CM | POA: Diagnosis not present

## 2019-01-07 DIAGNOSIS — Z981 Arthrodesis status: Secondary | ICD-10-CM | POA: Diagnosis not present

## 2019-01-07 DIAGNOSIS — M47816 Spondylosis without myelopathy or radiculopathy, lumbar region: Secondary | ICD-10-CM | POA: Diagnosis not present

## 2019-01-14 ENCOUNTER — Other Ambulatory Visit: Payer: Self-pay | Admitting: Obstetrics and Gynecology

## 2019-01-14 NOTE — Telephone Encounter (Signed)
Medication refill request: Climara- pro  Last AEX:  05-08-18 BS Next AEX: 06-06-2019  Last MMG (if hormonal medication request): 07-26-2018 density C/BIRADS 1 negative  Refill authorized: Today, please advise.   Medication pended for #4, 5RF. Please refill if appropriate.

## 2019-03-14 DIAGNOSIS — D1801 Hemangioma of skin and subcutaneous tissue: Secondary | ICD-10-CM | POA: Diagnosis not present

## 2019-03-14 DIAGNOSIS — L821 Other seborrheic keratosis: Secondary | ICD-10-CM | POA: Diagnosis not present

## 2019-03-14 DIAGNOSIS — L820 Inflamed seborrheic keratosis: Secondary | ICD-10-CM | POA: Diagnosis not present

## 2019-03-14 DIAGNOSIS — L814 Other melanin hyperpigmentation: Secondary | ICD-10-CM | POA: Diagnosis not present

## 2019-03-14 DIAGNOSIS — D2261 Melanocytic nevi of right upper limb, including shoulder: Secondary | ICD-10-CM | POA: Diagnosis not present

## 2019-03-26 ENCOUNTER — Telehealth: Payer: Self-pay | Admitting: Obstetrics and Gynecology

## 2019-03-26 NOTE — Telephone Encounter (Signed)
Left message on voicemail to call and reschedule cancelled appointment. °

## 2019-04-09 DIAGNOSIS — H3561 Retinal hemorrhage, right eye: Secondary | ICD-10-CM | POA: Diagnosis not present

## 2019-04-09 DIAGNOSIS — H40023 Open angle with borderline findings, high risk, bilateral: Secondary | ICD-10-CM | POA: Diagnosis not present

## 2019-04-09 DIAGNOSIS — H43813 Vitreous degeneration, bilateral: Secondary | ICD-10-CM | POA: Diagnosis not present

## 2019-04-09 DIAGNOSIS — H35373 Puckering of macula, bilateral: Secondary | ICD-10-CM | POA: Diagnosis not present

## 2019-04-28 ENCOUNTER — Encounter (INDEPENDENT_AMBULATORY_CARE_PROVIDER_SITE_OTHER): Payer: 59 | Admitting: Ophthalmology

## 2019-04-28 DIAGNOSIS — H3561 Retinal hemorrhage, right eye: Secondary | ICD-10-CM | POA: Diagnosis not present

## 2019-04-28 DIAGNOSIS — H43813 Vitreous degeneration, bilateral: Secondary | ICD-10-CM

## 2019-05-02 DIAGNOSIS — H3560 Retinal hemorrhage, unspecified eye: Secondary | ICD-10-CM | POA: Diagnosis not present

## 2019-05-26 ENCOUNTER — Ambulatory Visit: Payer: 59 | Admitting: Obstetrics and Gynecology

## 2019-06-06 ENCOUNTER — Ambulatory Visit: Payer: 59 | Admitting: Obstetrics and Gynecology

## 2019-06-24 ENCOUNTER — Ambulatory Visit: Payer: 59 | Admitting: Obstetrics and Gynecology

## 2019-07-01 ENCOUNTER — Other Ambulatory Visit: Payer: Self-pay | Admitting: Obstetrics and Gynecology

## 2019-07-01 DIAGNOSIS — Z1231 Encounter for screening mammogram for malignant neoplasm of breast: Secondary | ICD-10-CM

## 2019-07-02 NOTE — Progress Notes (Signed)
63 y.o. G3P3 Married Caucasian female here for annual exam.    No vaginal bleeding or spotting.   No pelvic pain.  No vaginal dryness.   On HRT. She has tried to stop in the past and had hot flashes and difficulty sleeping.   Having left hip issues.   She does have some elevated cholesterol and she saw her PCP.  She has an appointment with her PCP in Feb. 2021.   PCP:  Lavone Orn, MD  Patient's last menstrual period was 07/17/2008 (approximate).           Sexually active: Yes.    The current method of family planning is post menopausal status.    Exercising: Yes.    eliptical 3x/week, weights 3x/week and walking Smoker:  no  Health Maintenance: Pap:05-08-18 Neg:Neg HR HPV, 04/2017 normal per patient, 04-22-14 Neg History of abnormal Pap:  Yes, hx cryotherapy to cervix years ago.  MMG: 07-25-18 3D/Implants/Neg/density C/Birads1 Colonoscopy: 2019 normal;next 5 years BMD:07-25-18  Result :Osteopenia TDaP: ?unsure Gardasil:   no HIV:no Hep C:no Screening Labs:  Today.  Flu vaccine:  Completed.  Shingrix:  Completed first vaccine.    reports that she has never smoked. She has never used smokeless tobacco. She reports current alcohol use of about 1.0 standard drinks of alcohol per week. She reports that she does not use drugs.  Past Medical History:  Diagnosis Date  . Arthritis   . Cataracts, both eyes   . Family history of adverse reaction to anesthesia    MOTHER HAD NAUSEA   . Headache    "YRS AGO"  . Heart murmur    WHILE PREGNANT THE FIRST TIME, WAS TOLD THEN SHE HAD MURMUR.  AFTER 2 OTHER PREG, NOTHING WAS SAID  . Osteopenia     Past Surgical History:  Procedure Laterality Date  . AUGMENTATION MAMMAPLASTY    . BACK SURGERY  07/19/2017   synovial cyst removal and spinal fusion  . BREAST SURGERY    . EYE SURGERY    . FACIAL COSMETIC SURGERY     18 YRS AGO  . IMPLANTS  15 YRS AGO     BREAST  . LASIK     BOTH EYES - 18 YRS AGO  . TORN MENISCUS      REPAIRED  AROUND 5 YRS AGO    Current Outpatient Medications  Medication Sig Dispense Refill  . acetaminophen (TYLENOL) 500 MG tablet Take 1,000 mg by mouth 2 (two) times daily as needed for mild pain or moderate pain.    . Calcium-Vitamin D-Vitamin K 500-100-40 MG-UNT-MCG CHEW Chew 1 each by mouth daily.    . Cholecalciferol (VITAMIN D3) 1000 units CAPS Take 1,000 Units by mouth daily.    Marland Kitchen CLIMARA PRO 0.045-0.015 MG/DAY PLACE 1 PATCH ONTO THE SKIN ONCE A WEEK(SUNDAYS) 4 patch 4  . diclofenac (VOLTAREN) 50 MG EC tablet Take 1 tablet by mouth as needed.    Marland Kitchen omeprazole (PRILOSEC OTC) 20 MG tablet Take 21 tablets by mouth as needed.     No current facility-administered medications for this visit.    Family History  Problem Relation Age of Onset  . Crohn's disease Mother   . Breast cancer Mother 53  . Hypertension Mother   . Other Father        vasovagal syncope  . Cancer Maternal Grandmother 41       ?unsure if ovarian or cervical ca    Review of Systems  All other systems reviewed and  are negative.   Exam:   BP 118/62   Pulse 76   Temp 98.2 F (36.8 C) (Temporal)   Resp 16   Ht 5' 3.5" (1.613 m)   Wt 130 lb (59 kg)   LMP 07/17/2008 (Approximate)   BMI 22.67 kg/m     General appearance: alert, cooperative and appears stated age Head: normocephalic, without obvious abnormality, atraumatic Neck: no adenopathy, supple, symmetrical, trachea midline and thyroid normal to inspection and palpation Lungs: clear to auscultation bilaterally Breasts: bilateral implants, normal appearance, no masses or tenderness, No nipple retraction or dimpling, No nipple discharge or bleeding, No axillary adenopathy Heart: regular rate and rhythm Abdomen: soft, non-tender; no masses, no organomegaly Extremities: extremities normal, atraumatic, no cyanosis or edema Skin: skin color, texture, turgor normal. No rashes or lesions Lymph nodes: cervical, supraclavicular, and axillary nodes  normal. Neurologic: grossly normal  Pelvic: External genitalia:  no lesions              No abnormal inguinal nodes palpated.              Urethra:  normal appearing urethra with no masses, tenderness or lesions              Bartholins and Skenes: normal                 Vagina: normal appearing vagina with normal color and discharge, no lesions              Cervix: no lesions              Pap taken: No. Bimanual Exam:  Uterus:  normal size, contour, position, consistency, mobility, non-tender              Adnexa: no mass, fullness, tenderness              Rectal exam: Yes.  .  Confirms.              Anus:  normal sphincter tone, no lesions  Chaperone was present for exam.  Assessment:   Well woman visit with normal exam. Remote hx of cryotherapy for abnormal pap.  Osteopenia of hip and spine.  HRT patient.  Has bilateral implants.  FH breast cancer.  Mother in her 11s.  MGM with hx of reproductive cancer, possible ovarian cancer.  Hx back surgery.   Plan: Mammogram screening discussed. Self breast awareness reviewed. Pap and HR HPV as above.  New guidelines discussed.  Guidelines for Calcium, Vitamin D, regular exercise program including cardiovascular and weight bearing exercise. Routine labs.  BMD in 2022.  Discused WHI and use of HRT which can increase risk of PE, DVT, MI, stroke and breast cancer.  Refill of Climara Pro for one year.  Follow up annually and prn.   After visit summary provided.

## 2019-07-04 ENCOUNTER — Other Ambulatory Visit: Payer: Self-pay

## 2019-07-07 ENCOUNTER — Ambulatory Visit: Payer: BC Managed Care – PPO | Admitting: Obstetrics and Gynecology

## 2019-07-07 ENCOUNTER — Encounter: Payer: Self-pay | Admitting: Obstetrics and Gynecology

## 2019-07-07 ENCOUNTER — Other Ambulatory Visit: Payer: Self-pay

## 2019-07-07 VITALS — BP 118/62 | HR 76 | Temp 98.2°F | Resp 16 | Ht 63.5 in | Wt 130.0 lb

## 2019-07-07 DIAGNOSIS — Z01419 Encounter for gynecological examination (general) (routine) without abnormal findings: Secondary | ICD-10-CM

## 2019-07-07 MED ORDER — CLIMARA PRO 0.045-0.015 MG/DAY TD PTWK: MEDICATED_PATCH | TRANSDERMAL | 3 refills | Status: DC

## 2019-07-07 NOTE — Patient Instructions (Signed)

## 2019-07-08 LAB — LIPID PANEL
Chol/HDL Ratio: 2.6 ratio (ref 0.0–4.4)
Cholesterol, Total: 229 mg/dL — ABNORMAL HIGH (ref 100–199)
HDL: 87 mg/dL (ref >39)
LDL Chol Calc (NIH): 128 mg/dL — ABNORMAL HIGH (ref 0–99)
Triglycerides: 79 mg/dL (ref 0–149)
VLDL Cholesterol Cal: 14 mg/dL (ref 5–40)

## 2019-07-08 LAB — CBC
Hematocrit: 36.2 % (ref 34.0–46.6)
Hemoglobin: 12.4 g/dL (ref 11.1–15.9)
MCH: 31.6 pg (ref 26.6–33.0)
MCHC: 34.3 g/dL (ref 31.5–35.7)
MCV: 92 fL (ref 79–97)
Platelets: 241 10*3/uL (ref 150–450)
RBC: 3.93 x10E6/uL (ref 3.77–5.28)
RDW: 12.4 % (ref 11.7–15.4)
WBC: 5.3 10*3/uL (ref 3.4–10.8)

## 2019-07-08 LAB — COMPREHENSIVE METABOLIC PANEL
ALT: 10 IU/L (ref 0–32)
AST: 18 IU/L (ref 0–40)
Albumin/Globulin Ratio: 2 (ref 1.2–2.2)
Albumin: 4.3 g/dL (ref 3.8–4.8)
Alkaline Phosphatase: 71 IU/L (ref 39–117)
BUN/Creatinine Ratio: 20 (ref 12–28)
BUN: 13 mg/dL (ref 8–27)
Bilirubin Total: 0.4 mg/dL (ref 0.0–1.2)
CO2: 26 mmol/L (ref 20–29)
Calcium: 9.6 mg/dL (ref 8.7–10.3)
Chloride: 103 mmol/L (ref 96–106)
Creatinine, Ser: 0.66 mg/dL (ref 0.57–1.00)
GFR calc Af Amer: 108 mL/min/{1.73_m2} (ref >59)
GFR calc non Af Amer: 94 mL/min/{1.73_m2} (ref >59)
Globulin, Total: 2.2 g/dL (ref 1.5–4.5)
Glucose: 108 mg/dL — ABNORMAL HIGH (ref 65–99)
Potassium: 3.8 mmol/L (ref 3.5–5.2)
Sodium: 139 mmol/L (ref 134–144)
Total Protein: 6.5 g/dL (ref 6.0–8.5)

## 2019-07-28 ENCOUNTER — Ambulatory Visit: Payer: BC Managed Care – PPO | Admitting: Obstetrics and Gynecology

## 2019-07-29 ENCOUNTER — Other Ambulatory Visit: Payer: BLUE CROSS/BLUE SHIELD

## 2019-09-02 ENCOUNTER — Other Ambulatory Visit: Payer: Self-pay

## 2019-09-02 ENCOUNTER — Ambulatory Visit
Admission: RE | Admit: 2019-09-02 | Discharge: 2019-09-02 | Disposition: A | Discharge status: Home / Self Care | Payer: 59 | Source: Ambulatory Visit | Attending: Obstetrics and Gynecology | Admitting: Obstetrics and Gynecology

## 2019-09-02 DIAGNOSIS — Z1231 Encounter for screening mammogram for malignant neoplasm of breast: Secondary | ICD-10-CM

## 2019-10-15 ENCOUNTER — Other Ambulatory Visit: Payer: Self-pay | Admitting: Orthopedic Surgery

## 2019-10-15 DIAGNOSIS — M25562 Pain in left knee: Secondary | ICD-10-CM

## 2019-10-27 ENCOUNTER — Encounter (INDEPENDENT_AMBULATORY_CARE_PROVIDER_SITE_OTHER): Payer: BC Managed Care – PPO | Admitting: Ophthalmology

## 2019-11-11 ENCOUNTER — Other Ambulatory Visit: Payer: PRIVATE HEALTH INSURANCE

## 2019-12-12 ENCOUNTER — Ambulatory Visit
Admission: RE | Admit: 2019-12-12 | Discharge: 2019-12-12 | Disposition: A | Discharge status: Home / Self Care | Payer: BC Managed Care – PPO | Source: Ambulatory Visit | Attending: Orthopedic Surgery | Admitting: Orthopedic Surgery

## 2019-12-12 DIAGNOSIS — M25562 Pain in left knee: Secondary | ICD-10-CM

## 2020-06-13 ENCOUNTER — Other Ambulatory Visit: Payer: Self-pay | Admitting: Obstetrics and Gynecology

## 2020-06-13 DIAGNOSIS — Z01419 Encounter for gynecological examination (general) (routine) without abnormal findings: Secondary | ICD-10-CM

## 2020-06-14 MED ORDER — CLIMARA PRO 0.045-0.015 MG/DAY TD PTWK: MEDICATED_PATCH | TRANSDERMAL | 0 refills | Status: DC

## 2020-06-14 NOTE — Telephone Encounter (Signed)
Patient called in regards to refill of patch. Patient states she would like the refill today due to leaving town.

## 2020-06-14 NOTE — Addendum Note (Signed)
Addended by: York Cerise C on: 06/14/2020 09:24 AM   Modules accepted: Orders

## 2020-06-14 NOTE — Telephone Encounter (Signed)
Medication refill request: Climara patch 0.045 - 0.015mg   Last AEX:  07/07/19 Next AEX: 10/22/20 Last MMG (if hormonal medication request): 09/02/19 neg  Refill authorized: 12/0

## 2020-07-20 ENCOUNTER — Other Ambulatory Visit: Payer: Self-pay | Admitting: Obstetrics and Gynecology

## 2020-07-20 DIAGNOSIS — Z1231 Encounter for screening mammogram for malignant neoplasm of breast: Secondary | ICD-10-CM

## 2020-07-23 ENCOUNTER — Telehealth: Payer: Self-pay

## 2020-07-23 ENCOUNTER — Telehealth: Payer: Self-pay | Admitting: *Deleted

## 2020-07-23 NOTE — Telephone Encounter (Signed)
-----   Message from Zeb Comfort sent at 07/23/2020 10:58 AM EST ----- Regarding: having problems Patient is having a problem and would like to discuss with a nurse.

## 2020-07-23 NOTE — Telephone Encounter (Signed)
Patient called back and said she has been spotting for about 5 days, report no cycle in years, asked what to do. I advised patient office with Dr.Silva. patient is looking to see Dr.Silva the week of 08/02/20, I did advise a sooner appointment, however patient report her work schedule is busy.   Will route to [place] to schedule.

## 2020-07-23 NOTE — Telephone Encounter (Signed)
Patient scheduled on 07/27/19 with Dr.Silva

## 2020-07-23 NOTE — Telephone Encounter (Signed)
Left message for patient to call.

## 2020-07-23 NOTE — Telephone Encounter (Signed)
Opened in error

## 2020-07-25 NOTE — Progress Notes (Signed)
GYNECOLOGY  VISIT   HPI: 65 y.o.   Married  Caucasian  female   G3P3 with Patient's last menstrual period was 07/17/2008 (approximate).   here for vaginal bleeding that started 1week ago. Still spotting today, flow is light, requires panty liner.   States she had a bout of spotting 3 years ago.   No pain, discomfort, or cramping.   On ClimaraPro.  She started a patch one day early and then started the next patch one day late. Her spotting was closely after this.   Taking tumeric and fish oil.  GYNECOLOGIC HISTORY: Patient's last menstrual period was 07/17/2008 (approximate). Contraception: postmenopausal Menopausal hormone therapy: Combipatch once weekly Last mammogram:  09/02/19, negative, Bi-rads 1, C density, TBC.  Last pap smear:   05/08/2018, normal: HPV negative        OB History    Gravida  3   Para  3   Term      Preterm      AB      Living  3     SAB      IAB      Ectopic      Multiple      Live Births                 Patient Active Problem List   Diagnosis Date Noted   Synovial cyst of lumbar spine 07/19/2017    Past Medical History:  Diagnosis Date   Arthritis    Cataracts, both eyes    Family history of adverse reaction to anesthesia    MOTHER HAD NAUSEA    Headache    "YRS AGO"   Heart murmur    WHILE PREGNANT THE FIRST TIME, WAS TOLD THEN SHE HAD MURMUR.  AFTER 2 OTHER PREG, NOTHING WAS SAID   Osteopenia     Past Surgical History:  Procedure Laterality Date   AUGMENTATION MAMMAPLASTY     BACK SURGERY  07/19/2017   synovial cyst removal and spinal fusion   BREAST SURGERY     EYE SURGERY     FACIAL COSMETIC SURGERY     18 YRS AGO   IMPLANTS  15 YRS AGO     BREAST   LASIK     BOTH EYES - 18 YRS AGO   TORN MENISCUS      REPAIRED AROUND 5 YRS AGO    Current Outpatient Medications  Medication Sig Dispense Refill   acetaminophen (TYLENOL) 500 MG tablet Take 1,000 mg by mouth 2 (two) times daily as needed  for mild pain or moderate pain.     Calcium-Vitamin D-Vitamin K [id number] MG-UNT-MCG CHEW Chew 1 each by mouth daily.     Cholecalciferol (VITAMIN D3) 1000 units CAPS Take 1,000 Units by mouth daily.     diclofenac (VOLTAREN) 50 MG EC tablet Take 1 tablet by mouth as needed.     estradiol-levonorgestrel (CLIMARA PRO) 0.045-0.015 MG/DAY PLACE 1 PATCH ONTO THE SKIN ONCE A WEEK(SUNDAYS) 12 patch 0   omeprazole (PRILOSEC OTC) 20 MG tablet Take 21 tablets by mouth as needed.     No current facility-administered medications for this visit.     ALLERGIES: Sulfa antibiotics, Ampicillin, and Penicillins  Family History  Problem Relation Age of Onset   Crohn's disease Mother    Breast cancer Mother 56   Hypertension Mother    Other Father        vasovagal syncope   Cancer Maternal Grandmother 78       ?  unsure if ovarian or cervical ca    Social History   Socioeconomic History   Marital status: Married    Spouse name: Not on file   Number of children: Not on file   Years of education: Not on file   Highest education level: Not on file  Occupational History   Not on file  Tobacco Use   Smoking status: Never Smoker   Smokeless tobacco: Never Used  Vaping Use   Vaping Use: Never used  Substance and Sexual Activity   Alcohol use: Yes    Alcohol/week: 1.0 standard drink    Types: 1 Glasses of wine per week   Drug use: No   Sexual activity: Yes    Birth control/protection: Post-menopausal  Other Topics Concern   Not on file  Social History Narrative   Not on file   Social Determinants of Health   Financial Resource Strain: Not on file  Food Insecurity: Not on file  Transportation Needs: Not on file  Physical Activity: Not on file  Stress: Not on file  Social Connections: Not on file  Intimate Partner Violence: Not on file    Review of Systems  Genitourinary: Positive for vaginal bleeding.    PHYSICAL EXAMINATION:    BP 112/78 (BP Location:  Left Arm, Patient Position: Sitting)    Pulse 77    Resp 18    Ht 5\' 3"  (1.6 m)    Wt 124 lb 9.6 oz (56.5 kg)    LMP 07/17/2008 (Approximate)    SpO2 100%    BMI 22.07 kg/m     General appearance: alert, cooperative and appears stated age   Abdomen: soft, non-tender, no masses,  no organomegaly Inguinal nodes:  0.7 mm nontender right inguinal node.  Pelvic: External genitalia:  no lesions              Urethra:  normal appearing urethra with no masses, tenderness or lesions              Bartholins and Skenes: normal                 Vagina: normal appearing vagina with normal color and discharge, no lesions              Cervix: no lesions.  Old blood noted.                 Bimanual Exam:  Uterus:  normal size, contour, position, consistency, mobility, non-tender              Adnexa: no mass, fullness, tenderness                 Chaperone was present for exam.  ASSESSMENT  Postmenopausal bleeding.  HRT.  Cervical cancer screening.   PLAN  We discussed reasons for postmenopausal bleeding - HRT, atrophy, polyps, hyperplasia, and malignancy.  Pap and reflex HR HPV testing.  Return for pelvic ultrasound and possible endometrial biopsy.  Procedures explained.  Ok to continue HRT for now.  Questions invited and answered.  28 min  total time was spent for this patient encounter, including preparation, face-to-face counseling with the patient, coordination of care, and documentation of the encounter.

## 2020-07-26 ENCOUNTER — Encounter: Payer: Self-pay | Admitting: Obstetrics and Gynecology

## 2020-07-26 ENCOUNTER — Ambulatory Visit (INDEPENDENT_AMBULATORY_CARE_PROVIDER_SITE_OTHER): Payer: Medicare Other | Admitting: Obstetrics and Gynecology

## 2020-07-26 ENCOUNTER — Other Ambulatory Visit (HOSPITAL_COMMUNITY)
Admission: RE | Admit: 2020-07-26 | Discharge: 2020-07-26 | Disposition: A | Discharge status: Home / Self Care | Payer: Medicare Other | Source: Ambulatory Visit | Attending: Obstetrics and Gynecology | Admitting: Obstetrics and Gynecology

## 2020-07-26 ENCOUNTER — Other Ambulatory Visit: Payer: Self-pay

## 2020-07-26 VITALS — BP 112/78 | HR 77 | Resp 18 | Ht 63.0 in | Wt 124.6 lb

## 2020-07-26 DIAGNOSIS — Z124 Encounter for screening for malignant neoplasm of cervix: Secondary | ICD-10-CM | POA: Diagnosis present

## 2020-07-26 DIAGNOSIS — N950 Postmenopausal bleeding: Secondary | ICD-10-CM

## 2020-07-26 DIAGNOSIS — Z79890 Hormone replacement therapy (postmenopausal): Secondary | ICD-10-CM | POA: Diagnosis not present

## 2020-07-26 NOTE — Patient Instructions (Signed)
Postmenopausal Bleeding Postmenopausal bleeding is any bleeding that a woman has after she has entered menopause. Menopause is the end of a woman's fertile years. After menopause, a woman no longer ovulates and does not have menstrual periods. Therefore, she should no longer have bleeding from her vagina. Postmenopausal bleeding may have various causes, including:  Menopausal hormone therapy (MHT).  Endometrial atrophy. After menopause, low estrogen hormone levels cause the membrane that lines the uterus (endometrium) to become thin. You may have bleeding as the endometrium thins.  Endometrial hyperplasia. This condition is caused by excess estrogen hormones and low levels of progesterone hormones. The excess estrogen causes the endometrium to thicken, which can lead to bleeding. In some cases, this can lead to cancer of the uterus.  Endometrial cancer.  Noncancerous growths (polyps) on the endometrium, the lining of the uterus, or the cervix.  Uterine fibroids. These are noncancerous growths in or around the uterus muscle tissue that can cause heavy bleeding. Any type of postmenopausal bleeding, even if it appears to be a typical menstrual period, should be checked by your health care provider. Treatment will depend on the cause of the bleeding. Follow these instructions at home:  Pay attention to any changes in your symptoms. Let your health care provider know about them.  Avoid using tampons and douches as told by your health care provider.  Change your pads regularly.  Get regular pelvic exams, including Pap tests, as told by your health care provider.  Take iron supplements as told by your health care provider.  Take over-the-counter and prescription medicines only as told by your health care provider.  Keep all follow-up visits. This is important.   Contact a health care provider if:  You have new bleeding from the vagina after menopause.  You have pain in your abdomen. Get  help right away if:  You have a fever or chills.  You have severe pain with bleeding.  You are passing blood clots.  You have heavy bleeding, need more than 1 pad an hour, and have never experienced this before.  You have headaches or feel faint or dizzy. Summary  Postmenopausal bleeding is any bleeding that a woman has after she has entered into menopause.  Postmenopausal bleeding may have various causes. Treatment will depend on the cause of the bleeding.  Any type of postmenopausal bleeding, even if it appears to be a typical menstrual period, should be checked by your health care provider.  Be sure to pay attention to any changes in your symptoms and keep all follow-up visits. This information is not intended to replace advice given to you by your health care provider. Make sure you discuss any questions you have with your health care provider. Document Revised: 12/18/2019 Document Reviewed: 12/18/2019 Elsevier Patient Education  2021 Elsevier Inc.  

## 2020-07-29 ENCOUNTER — Encounter: Payer: Self-pay | Admitting: Obstetrics and Gynecology

## 2020-07-29 LAB — CYTOLOGY - PAP: Diagnosis: NEGATIVE

## 2020-08-10 NOTE — Telephone Encounter (Signed)
Call to patient. Per DPR, OK to leave message on voicemail.   Left voicemail requesting a return call to Hayley to review benefits and schedule recommended Pelvic ultrasound with Brook A. Silva, MD, FACOG 

## 2020-08-12 ENCOUNTER — Other Ambulatory Visit: Payer: Self-pay

## 2020-08-12 ENCOUNTER — Encounter: Payer: Self-pay | Admitting: Obstetrics and Gynecology

## 2020-08-12 ENCOUNTER — Ambulatory Visit (INDEPENDENT_AMBULATORY_CARE_PROVIDER_SITE_OTHER): Payer: Medicare Other | Admitting: Obstetrics and Gynecology

## 2020-08-12 ENCOUNTER — Ambulatory Visit (INDEPENDENT_AMBULATORY_CARE_PROVIDER_SITE_OTHER): Payer: Medicare Other

## 2020-08-12 VITALS — BP 100/62 | HR 68 | Ht 63.0 in | Wt 124.0 lb

## 2020-08-12 DIAGNOSIS — Z79890 Hormone replacement therapy (postmenopausal): Secondary | ICD-10-CM | POA: Diagnosis not present

## 2020-08-12 DIAGNOSIS — N950 Postmenopausal bleeding: Secondary | ICD-10-CM | POA: Diagnosis not present

## 2020-08-12 MED ORDER — CLIMARA PRO 0.045-0.015 MG/DAY TD PTWK: MEDICATED_PATCH | TRANSDERMAL | 0 refills | Status: DC

## 2020-08-12 NOTE — Progress Notes (Signed)
GYNECOLOGY  VISIT   HPI: 65 y.o.   Married  Caucasian  female   G3P3 with Patient's last menstrual period was 07/17/2008 (approximate).   here for PUS and possible EMB.  On ClimaraPro.  She started a patch one day early and then started the next patch one day late. Her spotting was closely after this.   Bleeding stopped shortly after her last visit on 07/26/20.  The HRT helps her sleep at night and helps with the vaginal dryness.  She tried to come off it in the past, and had a difficult time with menopausal symptoms.   GYNECOLOGIC HISTORY: Patient's last menstrual period was 07/17/2008 (approximate). Contraception:  PMP Menopausal hormone therapy:  Combipatch Last mammogram: 09/02/19, negative, Bi-rads 1 Last pap smear: 07-26-20 Neg, 05-08-18 Neg:Neg HR HPV, 04-22-14 Neg        OB History    Gravida  3   Para  3   Term      Preterm      AB      Living  3     SAB      IAB      Ectopic      Multiple      Live Births                 Patient Active Problem List   Diagnosis Date Noted  . Synovial cyst of lumbar spine 07/19/2017    Past Medical History:  Diagnosis Date  . Arthritis   . Cataracts, both eyes   . Family history of adverse reaction to anesthesia    MOTHER HAD NAUSEA   . Headache    "YRS AGO"  . Heart murmur    WHILE PREGNANT THE FIRST TIME, WAS TOLD THEN SHE HAD MURMUR.  AFTER 2 OTHER PREG, NOTHING WAS SAID  . Osteopenia     Past Surgical History:  Procedure Laterality Date  . AUGMENTATION MAMMAPLASTY    . BACK SURGERY  07/19/2017   synovial cyst removal and spinal fusion  . BREAST SURGERY    . EYE SURGERY    . FACIAL COSMETIC SURGERY     18 YRS AGO  . IMPLANTS  15 YRS AGO     BREAST  . LASIK     BOTH EYES - 18 YRS AGO  . TORN MENISCUS      REPAIRED AROUND 5 YRS AGO    Current Outpatient Medications  Medication Sig Dispense Refill  . acetaminophen (TYLENOL) 500 MG tablet Take 1,000 mg by mouth 2 (two) times daily as needed  for mild pain or moderate pain.    . Calcium-Vitamin D-Vitamin K 500-100-40 MG-UNT-MCG CHEW Chew 1 each by mouth daily.    . Cholecalciferol (VITAMIN D3) 1000 units CAPS Take 1,000 Units by mouth daily.    . cholecalciferol (VITAMIN D3) 25 MCG (1000 UNIT) tablet 1 tablet    . diclofenac (VOLTAREN) 50 MG EC tablet Take 1 tablet by mouth as needed.    Marland Kitchen estradiol-levonorgestrel (CLIMARA PRO) 0.045-0.015 MG/DAY PLACE 1 PATCH ONTO THE SKIN ONCE A WEEK(SUNDAYS) 12 patch 0  . FINACEA 15 % cream Apply topically.    . minocycline (MINOCIN) 50 MG capsule Take 50 mg by mouth daily as needed.    . Omega-3 Fatty Acids (FISH OIL) 1000 MG CAPS Take by mouth.    Marland Kitchen omeprazole (PRILOSEC OTC) 20 MG tablet Take 21 tablets by mouth as needed.     No current facility-administered medications for this visit.  ALLERGIES: Sulfa antibiotics, Ampicillin, and Penicillins  Family History  Problem Relation Age of Onset  . Crohn's disease Mother   . Breast cancer Mother 81  . Hypertension Mother   . Other Father        vasovagal syncope  . Cancer Maternal Grandmother 58       ?unsure if ovarian or cervical ca    Social History   Socioeconomic History  . Marital status: Married    Spouse name: Not on file  . Number of children: Not on file  . Years of education: Not on file  . Highest education level: Not on file  Occupational History  . Not on file  Tobacco Use  . Smoking status: Never Smoker  . Smokeless tobacco: Never Used  Vaping Use  . Vaping Use: Never used  Substance and Sexual Activity  . Alcohol use: Yes    Alcohol/week: 1.0 standard drink    Types: 1 Glasses of wine per week  . Drug use: No  . Sexual activity: Yes    Birth control/protection: Post-menopausal  Other Topics Concern  . Not on file  Social History Narrative  . Not on file   Social Determinants of Health   Financial Resource Strain: Not on file  Food Insecurity: Not on file  Transportation Needs: Not on file   Physical Activity: Not on file  Stress: Not on file  Social Connections: Not on file  Intimate Partner Violence: Not on file    Review of Systems  All other systems reviewed and are negative.   PHYSICAL EXAMINATION:    BP 100/62   Pulse 68   Ht 5\' 3"  (1.6 m)   Wt 124 lb (56.2 kg)   LMP 07/17/2008 (Approximate)   SpO2 99%   BMI 21.97 kg/m     General appearance: alert, cooperative and appears stated age   Pelvic US  Uterus with no myometrial masses . EMS 2.81 mm.  No endometrial masses.  Ovaries normal.  No adnexal masses.  No free fluid.   ASSESSMENT  Postmenopausal bleeding on HRT.  PLAN  Reassurance regarding her pelvic ultrasound findings.  OK to continue HRT.  We talked about how to wean off HRT in the future when the time is appropriate.  Keep mammogram appointment.  Annual exam in April, 2022.    15 min total time was spent for this patient encounter, including preparation, face-to-face counseling with the patient, coordination of care, and documentation of the encounter.

## 2020-08-30 ENCOUNTER — Other Ambulatory Visit: Payer: Self-pay | Admitting: Obstetrics and Gynecology

## 2020-08-30 DIAGNOSIS — Z1231 Encounter for screening mammogram for malignant neoplasm of breast: Secondary | ICD-10-CM

## 2020-09-03 ENCOUNTER — Ambulatory Visit: Payer: BC Managed Care – PPO

## 2020-09-21 IMAGING — MG DIGITAL SCREENING BREAST BILAT IMPLANT W/ TOMO W/ CAD
9 of 12 series · 9 of 28 positions shown · non-contrast
Comparison: Previous exam(s).

CLINICAL DATA: Screening.

EXAM:
DIGITAL SCREENING BILATERAL MAMMOGRAM WITH IMPLANTS, CAD AND TOMO
The patient has retropectoral implants. Standard and implant
displaced views were performed.

[R MLO]
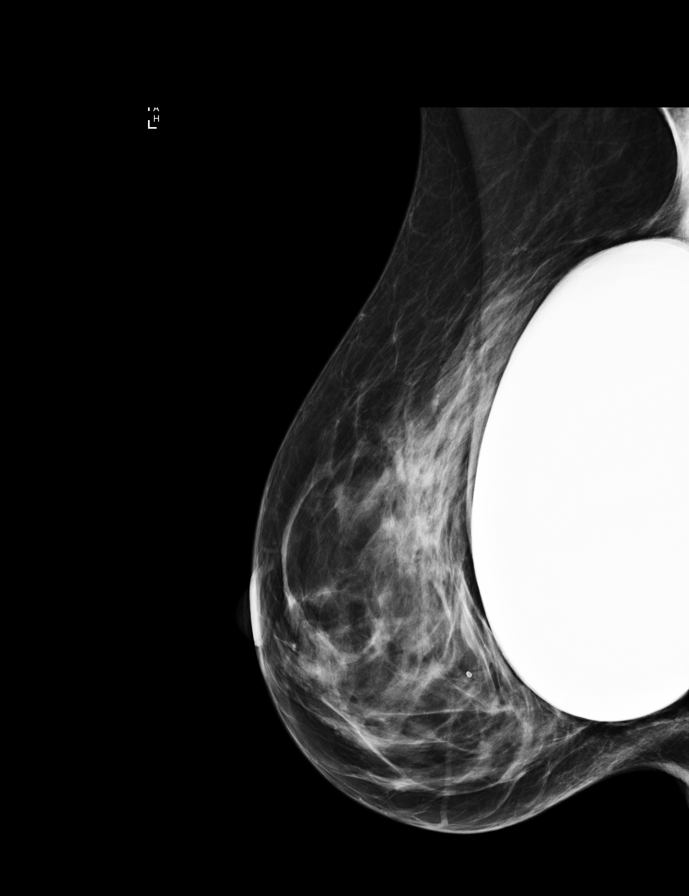

[L CC]
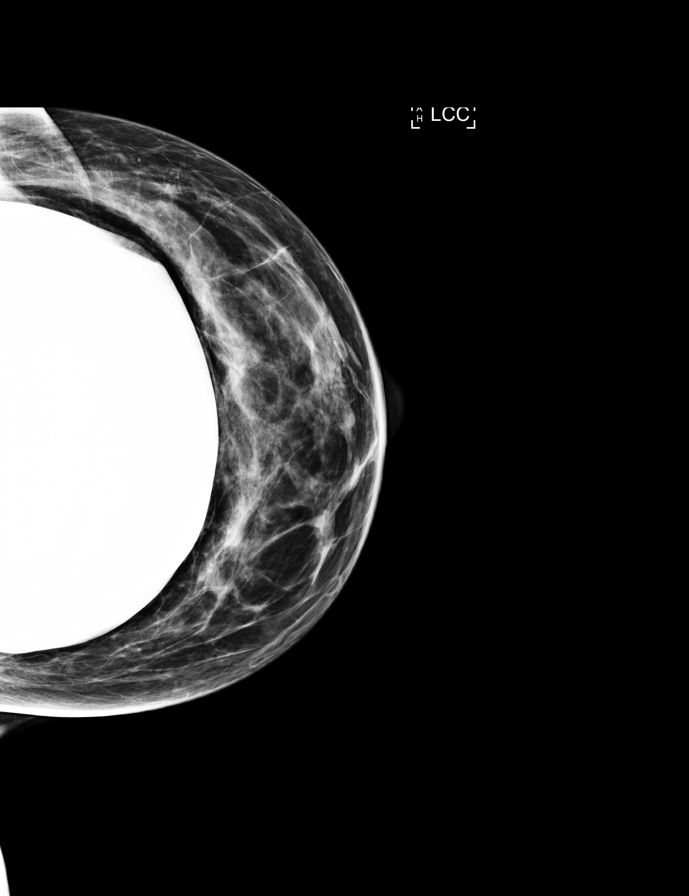

[R CC]
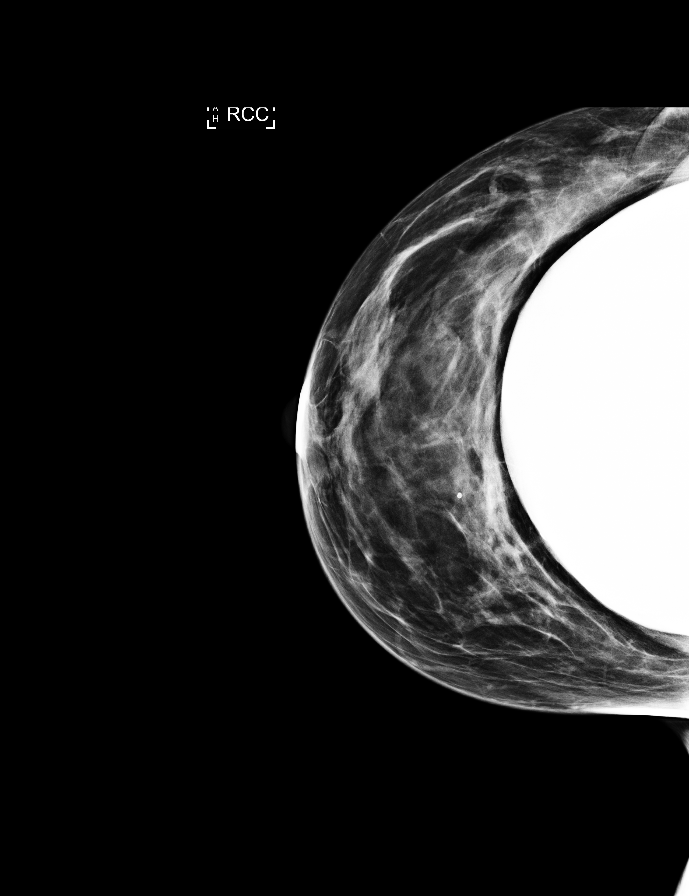

[L MLO]
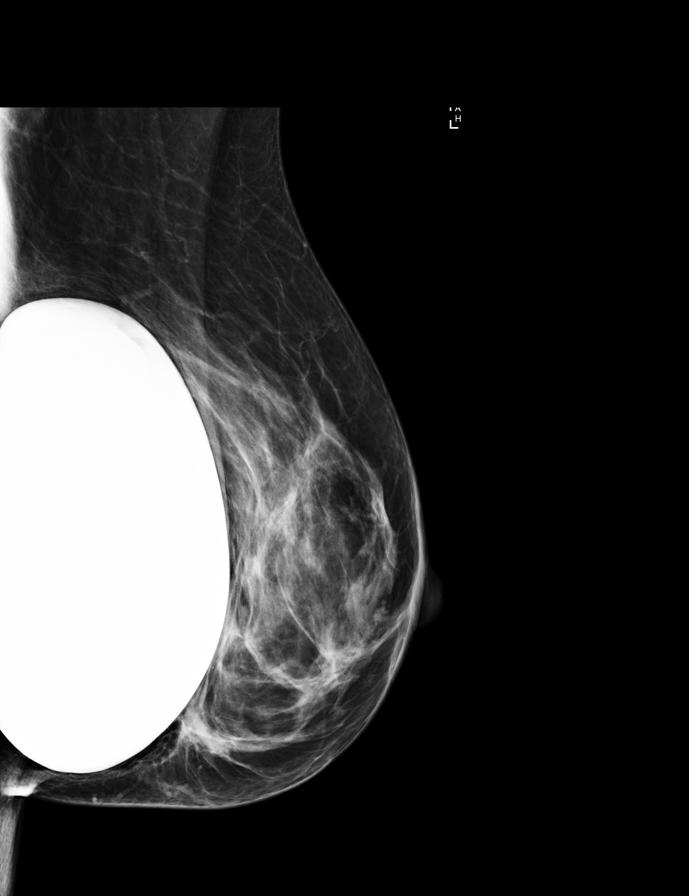

[R CC synth-2D]
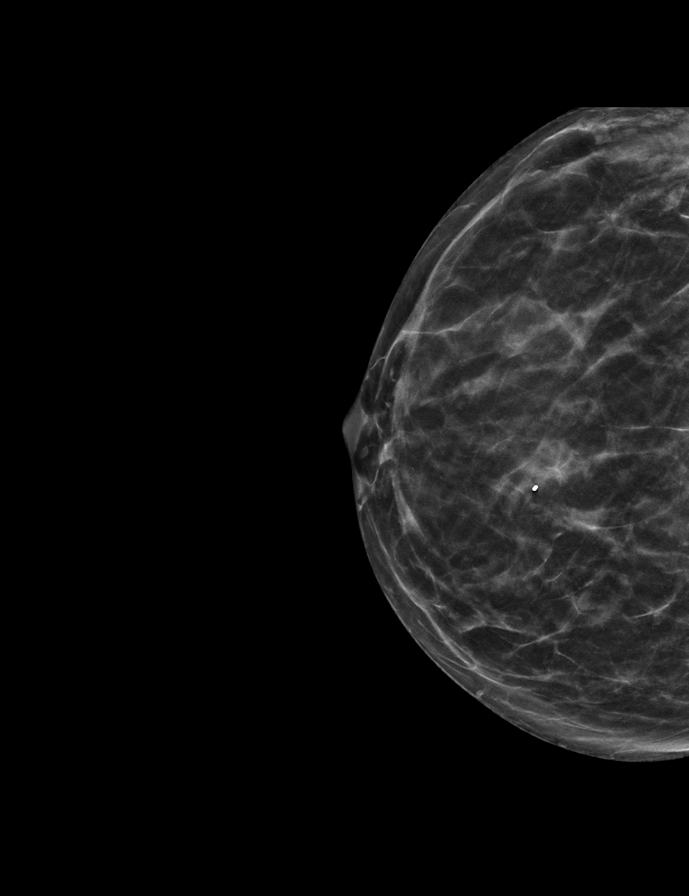

[L MLO synth-2D]
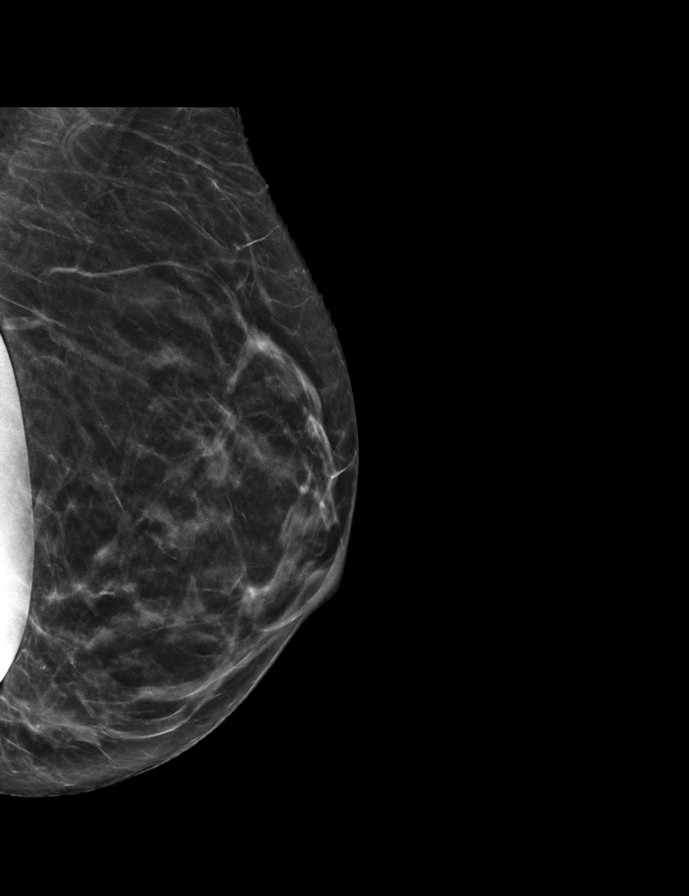

[R MLO synth-2D]
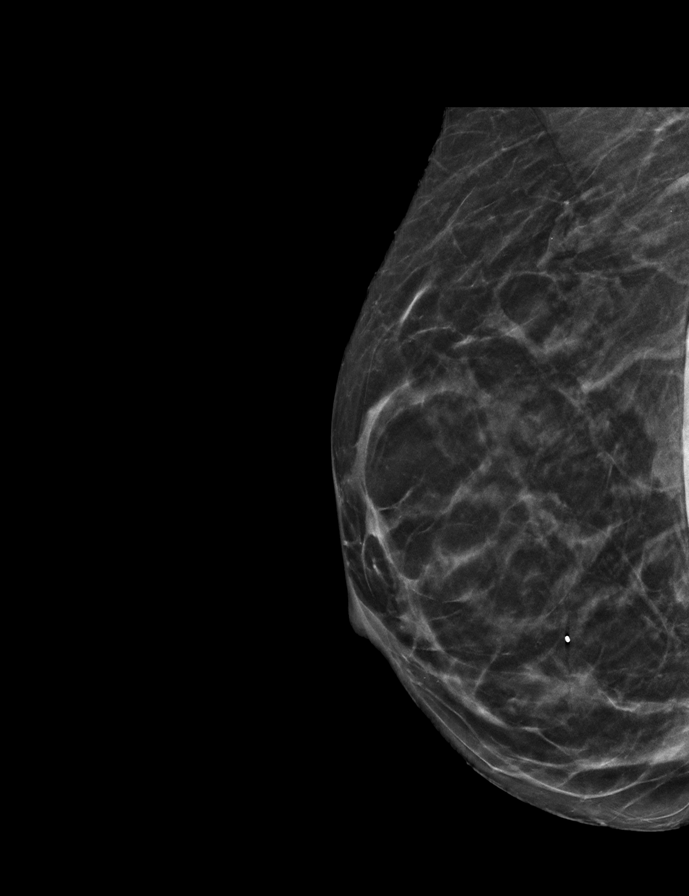

[L CC synth-2D]
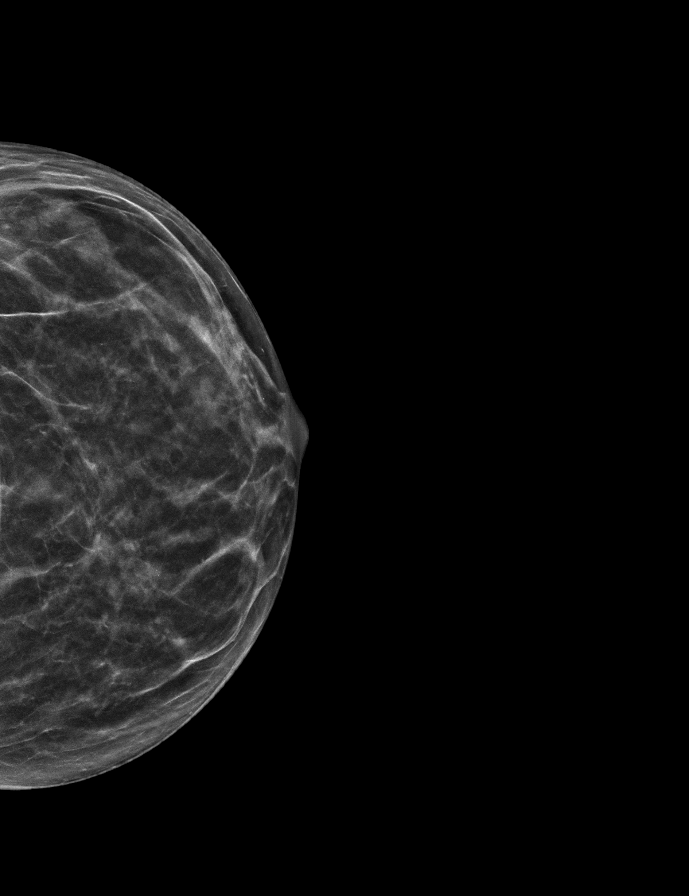

[L MLOID BREAST TOMOSYNTHESIS IMAGE tomo · tomo slice 24/47.0]
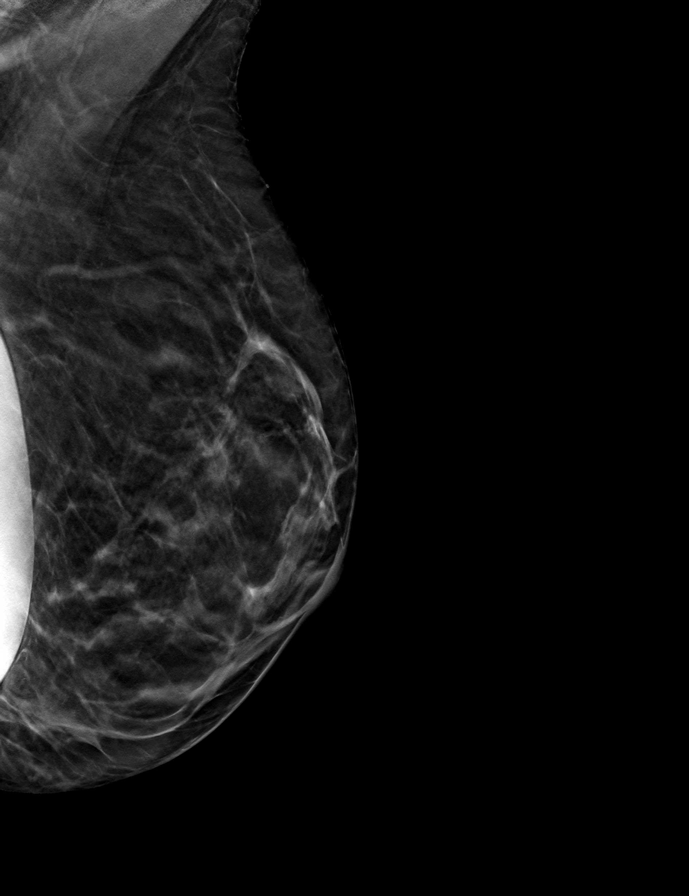

[9 of 28 positions shown; findings below may reference images not displayed]

ACR Breast Density Category c: The breast tissue is heterogeneously
dense, which may obscure small masses.
FINDINGS: There are no findings suspicious for malignancy. Images were
processed with CAD.
IMPRESSION: No mammographic evidence of malignancy. A result letter of this
screening mammogram will be mailed directly to the patient.

RECOMMENDATION:
Screening mammogram in one year. (Code:49-X-OQ9)

BI-RADS CATEGORY  1:  Negative.

## 2020-10-22 ENCOUNTER — Inpatient Hospital Stay: Admission: RE | Admit: 2020-10-22 | Payer: Medicare Other | Source: Ambulatory Visit

## 2020-10-27 ENCOUNTER — Ambulatory Visit
Admission: RE | Admit: 2020-10-27 | Discharge: 2020-10-27 | Disposition: A | Discharge status: Home / Self Care | Payer: Medicare Other | Source: Ambulatory Visit | Attending: Obstetrics and Gynecology | Admitting: Obstetrics and Gynecology

## 2020-10-27 ENCOUNTER — Other Ambulatory Visit: Payer: Self-pay

## 2020-10-27 DIAGNOSIS — Z1231 Encounter for screening mammogram for malignant neoplasm of breast: Secondary | ICD-10-CM

## 2020-10-29 ENCOUNTER — Other Ambulatory Visit: Payer: Self-pay | Admitting: Obstetrics and Gynecology

## 2020-10-29 DIAGNOSIS — R928 Other abnormal and inconclusive findings on diagnostic imaging of breast: Secondary | ICD-10-CM

## 2020-11-01 ENCOUNTER — Other Ambulatory Visit: Payer: Self-pay

## 2020-11-01 ENCOUNTER — Encounter: Payer: Self-pay | Admitting: Obstetrics and Gynecology

## 2020-11-01 ENCOUNTER — Ambulatory Visit (INDEPENDENT_AMBULATORY_CARE_PROVIDER_SITE_OTHER): Payer: Medicare Other | Admitting: Obstetrics and Gynecology

## 2020-11-01 ENCOUNTER — Ambulatory Visit
Admission: RE | Admit: 2020-11-01 | Discharge: 2020-11-01 | Disposition: A | Discharge status: Home / Self Care | Payer: Medicare Other | Source: Ambulatory Visit | Attending: Obstetrics and Gynecology | Admitting: Obstetrics and Gynecology

## 2020-11-01 ENCOUNTER — Ambulatory Visit: Payer: Medicare Other

## 2020-11-01 ENCOUNTER — Other Ambulatory Visit: Payer: Self-pay | Admitting: Obstetrics and Gynecology

## 2020-11-01 VITALS — BP 100/66 | HR 78 | Ht 63.75 in | Wt 124.0 lb

## 2020-11-01 DIAGNOSIS — Z01419 Encounter for gynecological examination (general) (routine) without abnormal findings: Secondary | ICD-10-CM

## 2020-11-01 DIAGNOSIS — Z1239 Encounter for other screening for malignant neoplasm of breast: Secondary | ICD-10-CM

## 2020-11-01 DIAGNOSIS — Z79890 Hormone replacement therapy (postmenopausal): Secondary | ICD-10-CM

## 2020-11-01 DIAGNOSIS — R928 Other abnormal and inconclusive findings on diagnostic imaging of breast: Secondary | ICD-10-CM

## 2020-11-01 MED ORDER — CLIMARA PRO 0.045-0.015 MG/DAY TD PTWK: MEDICATED_PATCH | TRANSDERMAL | 3 refills | Status: DC

## 2020-11-01 NOTE — Progress Notes (Signed)
GYNECOLOGY  VISIT   HPI: 65 y.o.   Married  Caucasian  female   G3P3 with Patient's last menstrual period was 07/17/2008 (approximate).   here for breast and pelvic exam.   Patient takes HRT, ClimaraPro.   She really loves it.   No vaginal dryness or hot flashes.  No more vaginal bleeding.   Had BMD today with [facility]. PCP ordered.  GYNECOLOGIC HISTORY: Patient's last menstrual period was 07/17/2008 (approximate). Contraception:  PMP Menopausal hormone therapy: Climara Pro Last mammogram: 10-27-20 3D/Implants/Rt.Br.asymmetry;Lt.Br.neg. Pt. Scheduled for Diag.Rt.w/Rt.[place] today  Last pap smear: 07-26-20 Neg, 05-08-18 Neg:Neg HR HPV, 04/2017 normal per patient        OB History    Gravida  3   Para  3   Term      Preterm      AB      Living  3     SAB      IAB      Ectopic      Multiple      Live Births                 Patient Active Problem List   Diagnosis Date Noted  . Synovial cyst of lumbar spine 07/19/2017    Past Medical History:  Diagnosis Date  . Arthritis   . Cataracts, both eyes   . Family history of adverse reaction to anesthesia    MOTHER HAD NAUSEA   . Headache    "YRS AGO"  . Heart murmur    WHILE PREGNANT THE FIRST TIME, WAS TOLD THEN SHE HAD MURMUR.  AFTER 2 OTHER PREG, NOTHING WAS SAID  . Osteopenia     Past Surgical History:  Procedure Laterality Date  . AUGMENTATION MAMMAPLASTY    . BACK SURGERY  07/19/2017   synovial cyst removal and spinal fusion  . BREAST SURGERY    . EYE SURGERY    . FACIAL COSMETIC SURGERY     18 YRS AGO  . IMPLANTS  15 YRS AGO     BREAST  . LASIK     BOTH EYES - 18 YRS AGO  . TORN MENISCUS      REPAIRED AROUND 5 YRS AGO    Current Outpatient Medications  Medication Sig Dispense Refill  . acetaminophen (TYLENOL) 500 MG tablet Take 1,000 mg by mouth 2 (two) times daily as needed for mild pain or moderate pain.    . calcium gluconate 500 MG tablet Take 1 tablet by mouth daily.    .  Cholecalciferol (VITAMIN D3) 1000 units CAPS Take 1,000 Units by mouth daily.    . diclofenac (VOLTAREN) 50 MG EC tablet Take 1 tablet by mouth as needed.    . Difluprednate (DUREZOL) 0.05 % EMUL Apply to eye.    . fexofenadine (ALLEGRA) 180 MG tablet Take 180 mg by mouth daily.    Marland Kitchen FINACEA 15 % cream Apply topically.    . minocycline (MINOCIN) 50 MG capsule Take 50 mg by mouth daily as needed.    . Omega-3 Fatty Acids (FISH OIL) 1000 MG CAPS Take by mouth.    . estradiol-levonorgestrel (CLIMARA PRO) 0.045-0.015 MG/DAY PLACE 1 PATCH ONTO THE SKIN ONCE A WEEK(SUNDAYS) 24 patch 3   No current facility-administered medications for this visit.     ALLERGIES: Sulfa antibiotics, Lifitegrast, Ampicillin, and Penicillins  Family History  Problem Relation Age of Onset  . Crohn's disease Mother   . Breast cancer Mother 24  . Hypertension Mother   .  Other Father        vasovagal syncope  . Cancer Maternal Grandmother 19       ?unsure if ovarian or cervical ca    Social History   Socioeconomic History  . Marital status: Married    Spouse name: Not on file  . Number of children: Not on file  . Years of education: Not on file  . Highest education level: Not on file  Occupational History  . Not on file  Tobacco Use  . Smoking status: Never Smoker  . Smokeless tobacco: Never Used  Vaping Use  . Vaping Use: Never used  Substance and Sexual Activity  . Alcohol use: Yes    Alcohol/week: 1.0 standard drink    Types: 1 Glasses of wine per week  . Drug use: No  . Sexual activity: Yes    Birth control/protection: Post-menopausal  Other Topics Concern  . Not on file  Social History Narrative  . Not on file   Social Determinants of Health   Financial Resource Strain: Not on file  Food Insecurity: Not on file  Transportation Needs: Not on file  Physical Activity: Not on file  Stress: Not on file  Social Connections: Not on file  Intimate Partner Violence: Not on file   ROS  See  HPI.  PHYSICAL EXAMINATION:    BP 100/66   Pulse 78   Ht 5' 3.75" (1.619 m)   Wt 124 lb (56.2 kg)   LMP 07/17/2008 (Approximate)   SpO2 98%   BMI 21.45 kg/m     General appearance: alert, cooperative and appears stated age Head: Normocephalic, without obvious abnormality, atraumatic Neck: no adenopathy, supple, symmetrical, trachea midline and thyroid normal to inspection and palpation Lungs: clear to auscultation bilaterally Breasts: bilateral implants, no masses or tenderness, No nipple retraction or dimpling, No nipple discharge or bleeding, No axillary or supraclavicular adenopathy Heart: regular rate and rhythm Abdomen: soft, non-tender, no masses,  no organomegaly Extremities: extremities normal, atraumatic, no cyanosis or edema Skin: Skin color, texture, turgor normal. No rashes or lesions No abnormal inguinal nodes palpated Neurologic: Grossly normal  Pelvic: External genitalia:  no lesions              Urethra:  normal appearing urethra with no masses, tenderness or lesions              Bartholins and Skenes: normal                 Vagina: normal appearing vagina with normal color and discharge, no lesions              Cervix: no lesions                Bimanual Exam:  Uterus:  normal size, contour, position, consistency, mobility, non-tender              Adnexa: no mass, fullness, tenderness              Rectal exam: Yes.  .  Confirms.              Anus:  normal sphincter tone, no lesions  Chaperone was present for exam.  ASSESSMENT  HRT patient.  Menopausal symptoms are controlled. Screening breast exam. FH breast cancer:  Mother in her 64s. Personal hx of bilateral breast implants. Pelvic exam with no abnormal findings. Remote hx of cryotherapy for abnormal pap.  MGM with hx of reproductive cancer, possible ovarian cancer.  Osteopenia of hip and  spine.  Hx back surgery.  PLAN  Patient wishes to continue ClimaraPro.    If she chooses to stop the  ClimaraPro, will consider Activella, which is on formulary.  Discused WHI and use of HRT which can increase risk of PE, DVT, MI, stroke and breast cancer.  Await dx mammogram. SBE encouraged. BMD done through PCP.   Guidelines for calcium and vit D intake.  FU yearly and prn.   33 min  total time was spent for this patient encounter, including preparation, face-to-face counseling with the patient, coordination of care, and documentation of the encounter.

## 2020-11-01 NOTE — Patient Instructions (Signed)

## 2020-11-03 ENCOUNTER — Telehealth: Payer: Self-pay | Admitting: *Deleted

## 2020-11-03 NOTE — Telephone Encounter (Signed)
PA done via cover my meds for climara pro 0.045 mg patch. [employer] medicare denied medication as it is NOT listed on her formulary drug list.

## 2020-11-05 NOTE — Telephone Encounter (Signed)
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 Estradiol patch twice weekly or weekly patch is on the preferred list. Patient is agreeable with trying this patch. Please advise

## 2020-11-06 ENCOUNTER — Ambulatory Visit: Payer: Medicare Other

## 2020-11-07 NOTE — Telephone Encounter (Signed)
Ok to discontinue ClimaraPro and start Vivelle Dot and Prometrium. Rx for Vivelle Dot 0.05 mg to skin twice weekly.  Dispense:  12, RF: 3. Prometrium 100 mg po q hs.  Dispense:  90, RF:  3.  Prometrium can cause some sleepiness and relaxation, so it is best to take at night.

## 2020-11-09 MED ORDER — PROGESTERONE MICRONIZED 100 MG PO CAPS: 100.0000 mg | ORAL_CAPSULE | Freq: Every day | ORAL | 3 refills | Status: DC

## 2020-11-09 MED ORDER — ESTRADIOL 0.05 MG/24HR TD PTTW: 1.0000 | MEDICATED_PATCH | TRANSDERMAL | 3 refills | Status: DC

## 2020-11-09 NOTE — Telephone Encounter (Signed)
Rx sent 

## 2021-05-12 NOTE — Progress Notes (Signed)
GYNECOLOGY  VISIT   HPI: 65 y.o.   Married  Caucasian  female   G3P3 with Patient's last menstrual period was 07/17/2008 (approximate).   here for irritation in right groin area x 2 months.  Feels like something is digging in to her skin.  Symptoms come and go.   No itching.  Has a burning sensation like an elastic is digging in.  No skin lesions. No bulge in the area.  No trauma or new exercise routine. No new exposures.   Tried Vaseline and powder to try to get some relief.    Had Shingrix vaccine but not shingles.   Wakes up with mildline lower back pain upon waking.  When she gets up and moving, it is improved.   She is running for re-election to the [place] [employer] [place].  GYNECOLOGIC HISTORY: Patient's last menstrual period was 07/17/2008 (approximate). Contraception:  PMP Menopausal hormone therapy:  Climara Pro 0.045mg  Last mammogram:  10-27-20 3D/Implants/Rt.Br.asymmetry;Lt.Br.neg. Rt.Diag Neg/BiRads1 Last pap smear:  07-26-20 Neg, 05-08-18 Neg:Neg HR HPV, 04/2017 normal per patient        OB History     Gravida  3   Para  3   Term      Preterm      AB      Living  3      SAB      IAB      Ectopic      Multiple      Live Births                 Patient Active Problem List   Diagnosis Date Noted   Synovial cyst of lumbar spine 07/19/2017    Past Medical History:  Diagnosis Date   Arthritis    Cataracts, both eyes    Family history of adverse reaction to anesthesia    MOTHER HAD NAUSEA    Headache    "YRS AGO"   Heart murmur    WHILE PREGNANT THE FIRST TIME, WAS TOLD THEN SHE HAD MURMUR.  AFTER 2 OTHER PREG, NOTHING WAS SAID   Osteopenia     Past Surgical History:  Procedure Laterality Date   AUGMENTATION MAMMAPLASTY     BACK SURGERY  07/19/2017   synovial cyst removal and spinal fusion   BREAST SURGERY     EYE SURGERY     FACIAL COSMETIC SURGERY     18 YRS AGO   IMPLANTS  15 YRS AGO     BREAST   LASIK     BOTH  EYES - 18 YRS AGO   TORN MENISCUS      REPAIRED AROUND 5 YRS AGO    Current Outpatient Medications  Medication Sig Dispense Refill   calcium gluconate 500 MG tablet Take 1 tablet by mouth daily.     Cholecalciferol (VITAMIN D3) 1000 units CAPS Take 1,000 Units by mouth daily.     diclofenac (VOLTAREN) 50 MG EC tablet Take 1 tablet by mouth as needed.     estradiol-levonorgestrel (CLIMARAPRO) 0.045-0.015 MG/DAY Place 1 patch onto the skin once a week.     fexofenadine (ALLEGRA) 180 MG tablet Take 180 mg by mouth daily.     FINACEA 15 % cream Apply topically.     minocycline (MINOCIN) 50 MG capsule Take 50 mg by mouth daily as needed.     No current facility-administered medications for this visit.     ALLERGIES: Sulfa antibiotics, Lifitegrast, Ampicillin, and Penicillins  Family History  Problem Relation Age of Onset   Crohn's disease Mother    Breast cancer Mother 56   Hypertension Mother    Other Father        vasovagal syncope   Cancer Maternal Grandmother 55       ?unsure if ovarian or cervical ca    Social History   Socioeconomic History   Marital status: Married    Spouse name: Not on file   Number of children: Not on file   Years of education: Not on file   Highest education level: Not on file  Occupational History   Not on file  Tobacco Use   Smoking status: Never   Smokeless tobacco: Never  Vaping Use   Vaping Use: Never used  Substance and Sexual Activity   Alcohol use: Yes    Alcohol/week: 1.0 standard drink    Types: 1 Glasses of wine per week   Drug use: No   Sexual activity: Yes    Birth control/protection: Post-menopausal  Other Topics Concern   Not on file  Social History Narrative   Not on file   Social Determinants of Health   Financial Resource Strain: Not on file  Food Insecurity: Not on file  Transportation Needs: Not on file  Physical Activity: Not on file  Stress: Not on file  Social Connections: Not on file  Intimate Partner  Violence: Not on file    Review of Systems  All other systems reviewed and are negative.  PHYSICAL EXAMINATION:    BP 110/64   Pulse 72   Ht 5\' 3"  (1.6 m)   Wt 124 lb (56.2 kg)   LMP 07/17/2008 (Approximate)   SpO2 97%   BMI 21.97 kg/m     General appearance: alert, cooperative and appears stated age  Abdomen: soft, non-tender, no masses,  no organomegaly.   Transdermal hormone patch is on her lower abdomen.   Pelvic: External genitalia:  no lesions              Urethra:  normal appearing urethra with no masses, tenderness or lesions              Bartholins and Skenes: normal                 Vagina: normal appearing vagina with normal color and discharge, no lesions              Cervix: no lesions                Bimanual Exam:  Uterus:  normal size, contour, position, consistency, mobility, non-tender              Adnexa: no mass, fullness, tenderness              Rectal exam: Yes.  .  Confirms.              Anus:  normal sphincter tone, no lesions  Chaperone was present for exam:  Estill Bamberg, CMA.   ASSESSMENT  Right groin pain.   Uncertain etiology.  No evidence of inguinal hernia.  Back pain.   PLAN  Will treat with local medications:  Triamcinolone ointment bid prn and xylocaine 5% ointment tid prn.  Try heat or ice.  If symptoms persist, consider follow up with PCP or orthopedist.    An After Visit Summary was printed and given to the patient.  25 min  total time was spent for this patient encounter, including preparation, face-to-face counseling with the  patient, coordination of care, and documentation of the encounter.

## 2021-05-16 ENCOUNTER — Other Ambulatory Visit: Payer: Self-pay

## 2021-05-16 ENCOUNTER — Encounter: Payer: Self-pay | Admitting: Obstetrics and Gynecology

## 2021-05-16 ENCOUNTER — Ambulatory Visit (INDEPENDENT_AMBULATORY_CARE_PROVIDER_SITE_OTHER): Payer: Medicare Other | Admitting: Obstetrics and Gynecology

## 2021-05-16 VITALS — BP 110/64 | HR 72 | Ht 63.0 in | Wt 124.0 lb

## 2021-05-16 DIAGNOSIS — R1031 Right lower quadrant pain: Secondary | ICD-10-CM

## 2021-05-16 MED ORDER — TRIAMCINOLONE ACETONIDE 0.025 % EX OINT: 1.0000 "application " | TOPICAL_OINTMENT | Freq: Two times a day (BID) | CUTANEOUS | 0 refills | Status: DC

## 2021-05-16 MED ORDER — LIDOCAINE 5 % EX OINT: 1.0000 "application " | TOPICAL_OINTMENT | Freq: Three times a day (TID) | CUTANEOUS | 0 refills | Status: DC

## 2021-05-19 ENCOUNTER — Other Ambulatory Visit: Payer: Self-pay | Admitting: *Deleted

## 2021-05-19 NOTE — Telephone Encounter (Signed)
PA done via cover my meds for lidocaine 5% ointment, will wait for response from [facility].

## 2021-05-27 NOTE — Telephone Encounter (Signed)
Left detailed message on patient voicemail that PA was denied by [facility] reports the is used for "lower right quad pain" is not approved for medicare part D. I did also leave on voicemail that lidocaine can be purchased OTC.

## 2021-10-04 ENCOUNTER — Other Ambulatory Visit: Payer: Self-pay | Admitting: Obstetrics and Gynecology

## 2021-10-04 DIAGNOSIS — Z1231 Encounter for screening mammogram for malignant neoplasm of breast: Secondary | ICD-10-CM

## 2021-11-08 ENCOUNTER — Other Ambulatory Visit: Payer: Self-pay | Admitting: Obstetrics and Gynecology

## 2021-11-08 NOTE — Telephone Encounter (Signed)
AEX 11/01/20. ?Scheduled 11/14/21. ? ?Mammo scheduled 11/15/21. ?Neg Mammo 11/01/20. ?

## 2021-11-08 NOTE — Progress Notes (Signed)
66 y.o. G3P3 Married Caucasian female here for annual breast and pelvic exam.   ? ?We follow the patient for her osteopenia.  ?Sister with severe osteoporosis. ? ?We prescribe her HRT, ClimaraPro. ?She would like to continue her HRT.  ? ?PCP:   Lavone Orn, MD ? ?Patient's last menstrual period was 07/17/2008 (approximate).     ?  ?    ?Sexually active: Yes.    ?The current method of family planning is post menopausal status.    ?Exercising: Yes.     Elliptical, pellaton, weights ?Smoker:  no ? ?Health Maintenance: ?Pap:   07-26-20 Neg, 05-08-18 Neg:Neg HR HPV, 04/2017 normal per patient       ?History of abnormal Pap:  Yes,  hx cryotherapy to cervix years ago, 42 years ago. ?MMG:  10-27-20 Neg/BiRads1--Has appt. This week ?Colonoscopy:  2019 normal;next 5 years ?BMD: 11-01-20  Result :Osteopenia ?TDaP:  04-23-13 ?Gardasil:   n/a ?HIV:no ?Hep C:no ?Screening Labs:  PCP. ? ? reports that she has never smoked. She has never used smokeless tobacco. She reports current alcohol use of about 1.0 standard drink per week. She reports that she does not use drugs. ? ?Past Medical History:  ?Diagnosis Date  ? Arthritis   ? Cataracts, both eyes   ? Family history of adverse reaction to anesthesia   ? MOTHER HAD NAUSEA   ? Headache   ? "YRS AGO"  ? Heart murmur   ? WHILE PREGNANT THE FIRST TIME, WAS TOLD THEN SHE HAD MURMUR.  AFTER 2 OTHER PREG, NOTHING WAS SAID  ? Osteopenia   ? ? ?Past Surgical History:  ?Procedure Laterality Date  ? AUGMENTATION MAMMAPLASTY    ? BACK SURGERY  07/19/2017  ? synovial cyst removal and spinal fusion  ? BREAST SURGERY    ? EYE SURGERY    ? FACIAL COSMETIC SURGERY    ? 4 YRS AGO  ? IMPLANTS  15 YRS AGO    ? BREAST  ? LASIK    ? BOTH EYES - 18 YRS AGO  ? TORN MENISCUS     ? REPAIRED AROUND 5 YRS AGO  ? ? ?Current Outpatient Medications  ?Medication Sig Dispense Refill  ? calcium gluconate 500 MG tablet Take 1 tablet by mouth daily.    ? Cholecalciferol (VITAMIN D3) 1000 units CAPS Take 1,000 Units by  mouth daily.    ? CLIMARA PRO 0.045-0.015 MG/DAY PLACE 1 PATCH ONTO THE SKIN ONCE WEEKLY 4 patch 0  ? diclofenac (VOLTAREN) 50 MG EC tablet Take 1 tablet by mouth as needed.    ? fexofenadine (ALLEGRA) 180 MG tablet Take 180 mg by mouth daily.    ? FINACEA 15 % cream Apply topically.    ? minocycline (MINOCIN) 50 MG capsule Take 50 mg by mouth daily as needed.    ? ?No current facility-administered medications for this visit.  ? ? ?Family History  ?Problem Relation Age of Onset  ? Crohn's disease Mother   ? Breast cancer Mother 45  ? Hypertension Mother   ? Other Father   ?     vasovagal syncope  ? Cancer Maternal Grandmother 9  ?     ?unsure if ovarian or cervical ca  ? ? ?Review of Systems  ?All other systems reviewed and are negative. ? ?Exam:   ?BP 110/64   Pulse 69   Ht '5\' 3"'$  (1.6 m)   Wt 128 lb (58.1 kg)   LMP 07/17/2008 (Approximate)   SpO2 99%  BMI 22.67 kg/m?     ?General appearance: alert, cooperative and appears stated age ?Head: normocephalic, without obvious abnormality, atraumatic ?Neck: no adenopathy, supple, symmetrical, trachea midline and thyroid normal to inspection and palpation ?Lungs: clear to auscultation bilaterally ?Breasts: consistent with bilateral implants, no masses or tenderness, No nipple retraction or dimpling, No nipple discharge or bleeding, No axillary adenopathy ?Heart: regular rate and rhythm ?Abdomen: soft, non-tender; no masses, no organomegaly ?Extremities: extremities normal, atraumatic, no cyanosis or edema ?Skin: skin color, texture, turgor normal. No rashes or lesions ?Lymph nodes: cervical, supraclavicular, and axillary nodes normal. ?Neurologic: grossly normal ? ?Pelvic: External genitalia:  no lesions ?             No abnormal inguinal nodes palpated. ?             Urethra:  normal appearing urethra with no masses, tenderness or lesions ?             Bartholins and Skenes: normal    ?             Vagina: normal appearing vagina with normal color and discharge,  no lesions ?             Cervix: no lesions ?             Pap taken: no ?Bimanual Exam:  Uterus:  normal size, contour, position, consistency, mobility, non-tender ?             Adnexa: no mass, fullness, tenderness ?             Rectal exam: yes.  Confirms. ?             Anus:  normal sphincter tone, no lesions ? ?Chaperone was present for exam:  Estill Bamberg, CMA ? ?Assessment:   ?Well woman visit with gynecologic exam. ?HRT patient.  Menopausal symptoms are controlled. ?FH breast cancer:  Mother in her 35s. ?Personal hx of bilateral breast implants. ?Remote hx of cryotherapy for abnormal pap.  ?MGM with hx of reproductive cancer, possible ovarian cancer.  ?Osteopenia of hip and spine.  No FRAX model as she is on HRT. ?Hx back surgery.  ? ?Plan: ?Mammogram screening discussed. ?Self breast awareness reviewed. ?Pap and HR HPV as above. ?Guidelines for Calcium, Vitamin D, regular exercise program including cardiovascular and weight bearing exercise. ?Prior BMD reviewed.   ?Risks factors for osteoporosis discussed.  ?Plan for BMD next year.  ?Discused WHI and use of HRT which can increase risk of PE, DVT, MI, stroke and breast cancer.  We also discussed benefits of preventing osteoporosis, reducing colon cancer risk and improving quality of life due to control of vasomotor symptoms.  ?She wishes to continue with HRT.  Rx of ClimaraPro for one year.  ?Follow up annually and prn.  ?  ?After visit summary provided.  ? ?25 min  total time was spent for this patient encounter, including preparation, face-to-face counseling with the patient, coordination of care, and documentation of the encounter. ? ? ? ? ?

## 2021-11-14 ENCOUNTER — Ambulatory Visit: Payer: Medicare Other

## 2021-11-14 ENCOUNTER — Encounter: Payer: Self-pay | Admitting: Obstetrics and Gynecology

## 2021-11-14 ENCOUNTER — Ambulatory Visit (INDEPENDENT_AMBULATORY_CARE_PROVIDER_SITE_OTHER): Payer: Medicare Other | Admitting: Obstetrics and Gynecology

## 2021-11-14 VITALS — BP 110/64 | HR 69 | Ht 63.0 in | Wt 128.0 lb

## 2021-11-14 DIAGNOSIS — Z01419 Encounter for gynecological examination (general) (routine) without abnormal findings: Secondary | ICD-10-CM

## 2021-11-14 DIAGNOSIS — Z79890 Hormone replacement therapy (postmenopausal): Secondary | ICD-10-CM | POA: Diagnosis not present

## 2021-11-14 DIAGNOSIS — M8589 Other specified disorders of bone density and structure, multiple sites: Secondary | ICD-10-CM

## 2021-11-14 MED ORDER — CLIMARA PRO 0.045-0.015 MG/DAY TD PTWK: MEDICATED_PATCH | TRANSDERMAL | 3 refills | Status: DC

## 2021-11-14 NOTE — Patient Instructions (Signed)

## 2021-11-15 ENCOUNTER — Encounter: Payer: Self-pay | Admitting: Obstetrics and Gynecology

## 2021-11-15 ENCOUNTER — Ambulatory Visit
Admission: RE | Admit: 2021-11-15 | Discharge: 2021-11-15 | Disposition: A | Discharge status: Home / Self Care | Payer: Medicare Other | Source: Ambulatory Visit | Attending: Obstetrics and Gynecology | Admitting: Obstetrics and Gynecology

## 2021-11-15 DIAGNOSIS — Z1231 Encounter for screening mammogram for malignant neoplasm of breast: Secondary | ICD-10-CM

## 2022-02-16 ENCOUNTER — Other Ambulatory Visit: Payer: Self-pay | Admitting: Orthopedic Surgery

## 2022-02-16 ENCOUNTER — Emergency Department (HOSPITAL_COMMUNITY): Payer: No Typology Code available for payment source

## 2022-02-16 ENCOUNTER — Emergency Department (HOSPITAL_COMMUNITY)
Admission: EM | Admit: 2022-02-16 | Discharge: 2022-02-16 | Disposition: A | Discharge status: Home / Self Care | Payer: No Typology Code available for payment source | Attending: Emergency Medicine | Admitting: Emergency Medicine

## 2022-02-16 ENCOUNTER — Encounter (HOSPITAL_COMMUNITY): Payer: Self-pay

## 2022-02-16 ENCOUNTER — Other Ambulatory Visit: Payer: Self-pay

## 2022-02-16 DIAGNOSIS — S92254A Nondisplaced fracture of navicular [scaphoid] of right foot, initial encounter for closed fracture: Secondary | ICD-10-CM

## 2022-02-16 DIAGNOSIS — Y92410 Unspecified street and highway as the place of occurrence of the external cause: Secondary | ICD-10-CM | POA: Diagnosis not present

## 2022-02-16 DIAGNOSIS — S0990XA Unspecified injury of head, initial encounter: Secondary | ICD-10-CM | POA: Insufficient documentation

## 2022-02-16 DIAGNOSIS — S2222XA Fracture of body of sternum, initial encounter for closed fracture: Secondary | ICD-10-CM

## 2022-02-16 DIAGNOSIS — M79671 Pain in right foot: Secondary | ICD-10-CM

## 2022-02-16 DIAGNOSIS — S99921A Unspecified injury of right foot, initial encounter: Secondary | ICD-10-CM | POA: Diagnosis present

## 2022-02-16 DIAGNOSIS — K8020 Calculus of gallbladder without cholecystitis without obstruction: Secondary | ICD-10-CM | POA: Diagnosis not present

## 2022-02-16 LAB — I-STAT CHEM 8, ED
BUN: 15 mg/dL (ref 8–23)
Calcium, Ion: 1.33 mmol/L (ref 1.15–1.40)
Chloride: 101 mmol/L (ref 98–111)
Creatinine, Ser: 0.6 mg/dL (ref 0.44–1.00)
Glucose, Bld: 105 mg/dL — ABNORMAL HIGH (ref 70–99)
HCT: 39 % (ref 36.0–46.0)
Hemoglobin: 13.3 g/dL (ref 12.0–15.0)
Potassium: 3.8 mmol/L (ref 3.5–5.1)
Sodium: 140 mmol/L (ref 135–145)
TCO2: 28 mmol/L (ref 22–32)

## 2022-02-16 MED ORDER — IOHEXOL 300 MG/ML  SOLN
100.0000 mL | Freq: Once | INTRAMUSCULAR | Status: AC | PRN
  Administered 2022-02-16: 100 mL via INTRAVENOUS

## 2022-02-16 MED ORDER — IBUPROFEN 600 MG PO TABS: 600.0000 mg | ORAL_TABLET | Freq: Four times a day (QID) | ORAL | 0 refills | Status: AC | PRN

## 2022-02-16 MED ORDER — ACETAMINOPHEN 500 MG PO TABS
1000.0000 mg | ORAL_TABLET | Freq: Once | ORAL | Status: AC
  Administered 2022-02-16: 1000 mg via ORAL
  Filled 2022-02-16: qty 2

## 2022-02-16 MED ORDER — HYDROCODONE-ACETAMINOPHEN 5-325 MG PO TABS: 1.0000 | ORAL_TABLET | ORAL | 0 refills | Status: DC | PRN

## 2022-02-16 MED ORDER — SODIUM CHLORIDE 0.9 % IV SOLN: Freq: Once | INTRAVENOUS | Status: AC
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
## 2022-02-16 NOTE — Progress Notes (Signed)
Orthopedic Tech Progress Note Patient Details:  Heidi Skinner Dec 06, 1955 [id number]  Short CAM walker applied to RLE. Showed pt and husband at bedside how to apply/adjust. Also demonstrated proper crutch use and how to adjust them. Encouraged gentle compression, ice, and elevation when at home.  Ortho Devices Type of Ortho Device: CAM walker, Crutches Ortho Device/Splint Location: RLE, crutches adjusted at bedside Ortho Device/Splint Interventions: Ordered, Application, Adjustment   Post Interventions Patient Tolerated: Well Instructions Provided: Care of device, Poper ambulation with device, Adjustment of device  Carolin Quang Jeri Modena 02/16/2022, 1:45 PM
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
## 2022-02-16 NOTE — Discharge Instructions (Addendum)
You have an abnormal spot on your cervical spine CT that the radiologist thinks may be a meningioma.  You will need to get an outpatient MRI of your cervical spine to further evaluate.

## 2022-02-16 NOTE — ED Notes (Signed)
Pt assisted to the bathroom with a wheelchair. Pt needed minimal assistance in ambulating. Able to successfully use the bathroom, she is back in bed and resting comfortably.

## 2022-02-17 ENCOUNTER — Ambulatory Visit
Admission: RE | Admit: 2022-02-17 | Discharge: 2022-02-17 | Disposition: A | Discharge status: Home / Self Care | Payer: BLUE CROSS/BLUE SHIELD | Source: Ambulatory Visit | Attending: Orthopedic Surgery | Admitting: Orthopedic Surgery

## 2022-02-17 DIAGNOSIS — M79671 Pain in right foot: Secondary | ICD-10-CM

## 2022-06-07 ENCOUNTER — Other Ambulatory Visit: Payer: Self-pay | Admitting: Neurosurgery

## 2022-06-07 DIAGNOSIS — D329 Benign neoplasm of meninges, unspecified: Secondary | ICD-10-CM

## 2022-07-04 ENCOUNTER — Ambulatory Visit
Admission: RE | Admit: 2022-07-04 | Discharge: 2022-07-04 | Disposition: A | Discharge status: Home / Self Care | Payer: BLUE CROSS/BLUE SHIELD | Source: Ambulatory Visit | Attending: Neurosurgery | Admitting: Neurosurgery

## 2022-07-04 DIAGNOSIS — D329 Benign neoplasm of meninges, unspecified: Secondary | ICD-10-CM

## 2022-07-06 ENCOUNTER — Other Ambulatory Visit: Payer: Self-pay | Admitting: Neurosurgery

## 2022-07-06 DIAGNOSIS — D329 Benign neoplasm of meninges, unspecified: Secondary | ICD-10-CM

## 2022-07-24 ENCOUNTER — Ambulatory Visit
Admission: RE | Admit: 2022-07-24 | Discharge: 2022-07-24 | Disposition: A | Discharge status: Home / Self Care | Payer: BLUE CROSS/BLUE SHIELD | Source: Ambulatory Visit | Attending: Neurosurgery | Admitting: Neurosurgery

## 2022-07-24 DIAGNOSIS — D329 Benign neoplasm of meninges, unspecified: Secondary | ICD-10-CM

## 2022-07-24 MED ORDER — GADOPICLENOL 0.5 MMOL/ML IV SOLN
6.0000 mL | Freq: Once | INTRAVENOUS | Status: AC | PRN
  Administered 2022-07-24: 6 mL via INTRAVENOUS

## 2022-10-16 ENCOUNTER — Other Ambulatory Visit: Payer: Self-pay | Admitting: Obstetrics and Gynecology

## 2022-10-16 DIAGNOSIS — Z0000 Encounter for general adult medical examination without abnormal findings: Secondary | ICD-10-CM

## 2022-11-06 ENCOUNTER — Other Ambulatory Visit: Payer: Self-pay | Admitting: Obstetrics and Gynecology

## 2022-11-09 MED ORDER — CLIMARA PRO 0.045-0.015 MG/DAY TD PTWK: MEDICATED_PATCH | TRANSDERMAL | 0 refills | Status: DC

## 2022-11-09 NOTE — Telephone Encounter (Signed)
AEX 11/14/2021 Scheduled 12/04/2022 for AEX.  Has run out of her patch.

## 2022-11-09 NOTE — Addendum Note (Signed)
Addended by: Keenan Bachelor on: 11/09/2022 12:19 PM   Modules accepted: Orders

## 2022-11-10 NOTE — Telephone Encounter (Signed)
Pt called back today stating that her refill request was refused and is requesting the refill.   CB pt and no answer, per DPR, LDVM on machine advising her that BS did send in 57mo supply to pharmacy requested and I advised her to give the pharmacy a call to make sure they received it and to give [place] a call if she needed further assistance.
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
## 2022-12-01 ENCOUNTER — Ambulatory Visit: Payer: Medicare Other

## 2022-12-04 ENCOUNTER — Other Ambulatory Visit (HOSPITAL_COMMUNITY)
Admission: RE | Admit: 2022-12-04 | Discharge: 2022-12-04 | Disposition: A | Discharge status: Home / Self Care | Payer: Medicare Other | Source: Ambulatory Visit | Attending: Obstetrics and Gynecology | Admitting: Obstetrics and Gynecology

## 2022-12-04 ENCOUNTER — Ambulatory Visit (INDEPENDENT_AMBULATORY_CARE_PROVIDER_SITE_OTHER): Payer: Medicare Other | Admitting: Obstetrics and Gynecology

## 2022-12-04 ENCOUNTER — Encounter: Payer: Self-pay | Admitting: Obstetrics and Gynecology

## 2022-12-04 VITALS — BP 122/84 | HR 87 | Ht 63.5 in | Wt 124.0 lb

## 2022-12-04 DIAGNOSIS — M8580 Other specified disorders of bone density and structure, unspecified site: Secondary | ICD-10-CM

## 2022-12-04 DIAGNOSIS — Z124 Encounter for screening for malignant neoplasm of cervix: Secondary | ICD-10-CM | POA: Insufficient documentation

## 2022-12-04 DIAGNOSIS — Z87440 Personal history of urinary (tract) infections: Secondary | ICD-10-CM | POA: Diagnosis not present

## 2022-12-04 DIAGNOSIS — Z8781 Personal history of (healed) traumatic fracture: Secondary | ICD-10-CM | POA: Diagnosis not present

## 2022-12-04 DIAGNOSIS — Z780 Asymptomatic menopausal state: Secondary | ICD-10-CM

## 2022-12-04 DIAGNOSIS — Z5181 Encounter for therapeutic drug level monitoring: Secondary | ICD-10-CM

## 2022-12-04 DIAGNOSIS — Z01419 Encounter for gynecological examination (general) (routine) without abnormal findings: Secondary | ICD-10-CM

## 2022-12-04 DIAGNOSIS — Z1151 Encounter for screening for human papillomavirus (HPV): Secondary | ICD-10-CM | POA: Insufficient documentation

## 2022-12-04 MED ORDER — CLIMARA PRO 0.045-0.015 MG/DAY TD PTWK: MEDICATED_PATCH | TRANSDERMAL | 3 refills | Status: DC

## 2022-12-04 MED ORDER — NITROFURANTOIN MONOHYD MACRO 100 MG PO CAPS: 100.0000 mg | ORAL_CAPSULE | Freq: Two times a day (BID) | ORAL | 1 refills | Status: DC

## 2022-12-04 NOTE — Patient Instructions (Signed)
EXERCISE AND DIET:  We recommended that you start or continue a regular exercise program for good health. Regular exercise means any activity that makes your heart beat faster and makes you sweat.  We recommend exercising at least 30 minutes per day at least 3 days a week, preferably 4 or 5.  We also recommend a diet low in fat and sugar.  Inactivity, poor dietary choices and obesity can cause diabetes, heart attack, stroke, and kidney damage, among others.    ALCOHOL AND SMOKING:  Women should limit their alcohol intake to no more than 7 drinks/beers/glasses of wine (combined, not each!) per week. Moderation of alcohol intake to this level decreases your risk of breast cancer and liver damage. And of course, no recreational drugs are part of a healthy lifestyle.  And absolutely no smoking or even second hand smoke. Most people know smoking can cause heart and lung diseases, but did you know it also contributes to weakening of your bones? Aging of your skin?  Yellowing of your teeth and nails?  CALCIUM AND VITAMIN D:  Adequate intake of calcium and Vitamin D are recommended.  The recommendations for exact amounts of these supplements seem to change often, but generally speaking 600 mg of calcium (either carbonate or citrate) and 800 units of Vitamin D per day seems prudent. Certain women may benefit from higher intake of Vitamin D.  If you are among these women, your doctor will have told you during your visit.    PAP SMEARS:  Pap smears, to check for cervical cancer or precancers,  have traditionally been done yearly, although recent scientific advances have shown that most women can have pap smears less often.  However, every woman still should have a physical exam from her gynecologist every year. It will include a breast check, inspection of the vulva and vagina to check for abnormal growths or skin changes, a visual exam of the cervix, and then an exam to evaluate the size and shape of the uterus and  ovaries.  And after 68 years of age, a rectal exam is indicated to check for rectal cancers. We will also provide age appropriate advice regarding health maintenance, like when you should have certain vaccines, screening for sexually transmitted diseases, bone density testing, colonoscopy, mammograms, etc.   MAMMOGRAMS:  All women over 40 years old should have a yearly mammogram. Many facilities now offer a "3D" mammogram, which may cost around $50 extra out of pocket. If possible,  we recommend you accept the option to have the 3D mammogram performed.  It both reduces the number of women who will be called back for extra views which then turn out to be normal, and it is better than the routine mammogram at detecting truly abnormal areas.    COLONOSCOPY:  Colonoscopy to screen for colon cancer is recommended for all women at age 50.  We know, you hate the idea of the prep.  We agree, BUT, having colon cancer and not knowing it is worse!!  Colon cancer so often starts as a polyp that can be seen and removed at colonscopy, which can quite literally save your life!  And if your first colonoscopy is normal and you have no family history of colon cancer, most women don't have to have it again for 10 years.  Once every ten years, you can do something that may end up saving your life, right?  We will be happy to help you get it scheduled when you are ready.    Be sure to check your insurance coverage so you understand how much it will cost.  It may be covered as a preventative service at no cost, but you should check your particular policy.    Calcium Content in Foods Calcium is the most abundant mineral in the body. Most of the body's calcium supply is stored in bones and teeth. Calcium helps many parts of the body function normally, including: Blood and blood vessels. Nerves. Hormones. Muscles. Bones and teeth. When your calcium stores are low, you may be at risk for low bone mass, bone loss, and broken bones  (fractures). When you get enough calcium, it helps to support strong bones and teeth throughout your life. Calcium is especially important for: Children during growth spurts. Girls during adolescence. Women who are pregnant or breastfeeding. Women after their menstrual cycle stops (postmenopause). Women whose menstrual cycle has stopped due to anorexia nervosa or regular intense exercise. People who cannot eat or digest dairy products. Vegans. Recommended daily amounts of calcium: Women (ages 19 to 50): 1,000 mg per day. Women (ages 51 and older): 1,200 mg per day. Men (ages 19 to 70): 1,000 mg per day. Men (ages 71 and older): 1,200 mg per day. Women (ages 9 to 18): 1,300 mg per day. Men (ages 9 to 18): 1,300 mg per day. General information Eat foods that are high in calcium. Try to get most of your calcium from food. Some people may benefit from taking calcium supplements. Check with your health care provider or diet and nutrition specialist (dietitian) before starting any calcium supplements. Calcium supplements may interact with certain medicines. Too much calcium may cause other health problems, such as constipation and kidney stones. For the body to absorb calcium, it needs vitamin D. Sources of vitamin D include: Skin exposure to direct sunlight. Foods, such as egg yolks, liver, mushrooms, saltwater fish, and fortified milk. Vitamin D supplements. Check with your health care provider or dietitian before starting any vitamin D supplements. What foods are high in calcium?  Foods that are high in calcium contain more than 100 milligrams per serving. Fruits Fortified orange juice or other fruit juice, 300 mg per 8 oz serving. Vegetables Collard greens, 360 mg per 8 oz serving. Kale, 100 mg per 8 oz serving. Bok choy, 160 mg per 8 oz serving. Grains Fortified ready-to-eat cereals, 100 to 1,000 mg per 8 oz serving. Fortified frozen waffles, 200 mg in 2 waffles. Oatmeal, 140 mg in  1 cup. Meats and other proteins Sardines, canned with bones, 325 mg per 3 oz serving. Salmon, canned with bones, 180 mg per 3 oz serving. Canned shrimp, 125 mg per 3 oz serving. Baked beans, 160 mg per 4 oz serving. Tofu, firm, made with calcium sulfate, 253 mg per 4 oz serving. Dairy Yogurt, plain, low-fat, 310 mg per 6 oz serving. Nonfat milk, 300 mg per 8 oz serving. American cheese, 195 mg per 1 oz serving. Cheddar cheese, 205 mg per 1 oz serving. Cottage cheese 2%, 105 mg per 4 oz serving. Fortified soy, rice, or almond milk, 300 mg per 8 oz serving. Mozzarella, part skim, 210 mg per 1 oz serving. The items listed above may not be a complete list of foods high in calcium. Actual amounts of calcium may be different depending on processing. Contact a dietitian for more information. What foods are lower in calcium? Foods that are lower in calcium contain 50 mg or less per serving. Fruits Apple, about 6 mg. Banana, about 12 mg.   Vegetables Lettuce, 19 mg per 2 oz serving. Tomato, about 11 mg. Grains Rice, 4 mg per 6 oz serving. Boiled potatoes, 14 mg per 8 oz serving. White bread, 6 mg per slice. Meats and other proteins Egg, 27 mg per 2 oz serving. Red meat, 7 mg per 4 oz serving. Chicken, 17 mg per 4 oz serving. Fish, cod, or trout, 20 mg per 4 oz serving. Dairy Cream cheese, regular, 14 mg per 1 Tbsp serving. Brie cheese, 50 mg per 1 oz serving. Parmesan cheese, 70 mg per 1 Tbsp serving. The items listed above may not be a complete list of foods lower in calcium. Actual amounts of calcium may be different depending on processing. Contact a dietitian for more information. Summary Calcium is an important mineral in the body because it affects many functions. Getting enough calcium helps support strong bones and teeth throughout your life. Try to get most of your calcium from food. Calcium supplements may interact with certain medicines. Check with your health care provider  or dietitian before starting any calcium supplements. This information is not intended to replace advice given to you by your health care provider. Make sure you discuss any questions you have with your health care provider. Document Revised: 10/29/2019 Document Reviewed: 10/29/2019 Elsevier Patient Education  2023 Elsevier Inc.  

## 2022-12-05 ENCOUNTER — Telehealth: Payer: Self-pay

## 2022-12-05 LAB — CYTOLOGY - PAP
Comment: NEGATIVE
Diagnosis: NEGATIVE
High risk HPV: NEGATIVE

## 2022-12-05 NOTE — Telephone Encounter (Signed)
Pt LVM in triage line stating she contacted [facility] to schedule appt for DEXA scan and reports their next available appt isn't until 06/2023 so she contacted [facility] (states she had DEXA done there in the past in 2022) and has a sooner appt scheduled but needs order faxed over.   Will fill out form and place on your desk for authorization.

## 2022-12-05 NOTE — Telephone Encounter (Signed)
Order for BMD at [facility] signed and returned to you.

## 2022-12-05 NOTE — Telephone Encounter (Signed)
DEXA order faxed successfully to [facility]. LDVM on machine per DPR advising pt that order has been faxed per request. Will close encounter.

## 2022-12-22 ENCOUNTER — Ambulatory Visit: Payer: Medicare Other

## 2023-01-01 ENCOUNTER — Ambulatory Visit
Admission: RE | Admit: 2023-01-01 | Discharge: 2023-01-01 | Disposition: A | Discharge status: Home / Self Care | Payer: Medicare Other | Source: Ambulatory Visit | Attending: Obstetrics and Gynecology | Admitting: Obstetrics and Gynecology

## 2023-01-01 DIAGNOSIS — Z0000 Encounter for general adult medical examination without abnormal findings: Secondary | ICD-10-CM

## 2023-01-01 DIAGNOSIS — M810 Age-related osteoporosis without current pathological fracture: Secondary | ICD-10-CM

## 2023-01-01 HISTORY — DX: Age-related osteoporosis without current pathological fracture: M81.0

## 2023-01-02 ENCOUNTER — Encounter: Payer: Self-pay | Admitting: Obstetrics and Gynecology

## 2023-01-04 NOTE — Progress Notes (Unsigned)
She could set up a virtual visit with Dr Edward Jolly

## 2023-01-22 NOTE — Progress Notes (Deleted)
GYNECOLOGY  VISIT   HPI: 67 y.o.   Married  Caucasian  female   G3P3 with Patient's last menstrual period was 07/17/2008 (approximate).   here for   discuss dexa  GYNECOLOGIC HISTORY: Patient's last menstrual period was 07/17/2008 (approximate). Contraception:  PMP Menopausal hormone therapy:  climara Last mammogram:  11/01/20 Breast Density Cat C, BI-RADS CAT 1 neg Last pap smear:   12/04/22 neg: HR HPV neg, 07/26/20 neg        OB History     Gravida  3   Para  3   Term      Preterm      AB      Living  3      SAB      IAB      Ectopic      Multiple      Live Births                 Patient Active Problem List   Diagnosis Date Noted   Synovial cyst of lumbar spine 07/19/2017    Past Medical History:  Diagnosis Date   Arthritis    Cataracts, both eyes    Family history of adverse reaction to anesthesia    MOTHER HAD NAUSEA    Headache    "YRS AGO"   Heart murmur    WHILE PREGNANT THE FIRST TIME, WAS TOLD THEN SHE HAD MURMUR.  AFTER 2 OTHER PREG, NOTHING WAS SAID   Osteopenia     Past Surgical History:  Procedure Laterality Date   AUGMENTATION MAMMAPLASTY     BACK SURGERY  07/19/2017   synovial cyst removal and spinal fusion   BREAST SURGERY     EYE SURGERY     FACIAL COSMETIC SURGERY     18 YRS AGO   IMPLANTS  15 YRS AGO     BREAST   LASIK     BOTH EYES - 18 YRS AGO   TORN MENISCUS      REPAIRED AROUND 5 YRS AGO    Current Outpatient Medications  Medication Sig Dispense Refill   calcium gluconate 500 MG tablet Take 1 tablet by mouth daily.     Cholecalciferol (VITAMIN D3) 1000 units CAPS Take 1,000 Units by mouth daily.     estradiol-levonorgestrel (CLIMARA PRO) 0.045-0.015 MG/DAY PLACE 1 PATCH ONTO THE SKIN ONCE WEEKLY 12 patch 3   fexofenadine (ALLEGRA) 180 MG tablet Take 180 mg by mouth daily.     FINACEA 15 % cream Apply topically.     ibuprofen (ADVIL) 600 MG tablet Take 1 tablet (600 mg total) by mouth every 6 (six) hours as  needed. 30 tablet 0   minocycline (MINOCIN) 50 MG capsule Take 50 mg by mouth daily as needed.     nitrofurantoin, macrocrystal-monohydrate, (MACROBID) 100 MG capsule Take 1 capsule (100 mg total) by mouth 2 (two) times daily. Take twice daily for 5 days as needed for urinary tract infection. 30 capsule 1   No current facility-administered medications for this visit.     ALLERGIES: Sulfa antibiotics, Lifitegrast, Ampicillin, and Penicillins  Family History  Problem Relation Age of Onset   Crohn's disease Mother    Breast cancer Mother 62   Hypertension Mother    Other Father        vasovagal syncope   Cancer Maternal Grandmother 76       ?unsure if ovarian or cervical ca    Social History   Socioeconomic History  Marital status: Married    Spouse name: Not on file   Number of children: Not on file   Years of education: Not on file   Highest education level: Not on file  Occupational History   Not on file  Tobacco Use   Smoking status: Never   Smokeless tobacco: Never  Vaping Use   Vaping Use: Never used  Substance and Sexual Activity   Alcohol use: Yes    Alcohol/week: 1.0 standard drink of alcohol    Types: 1 Glasses of wine per week   Drug use: No   Sexual activity: Yes    Birth control/protection: Post-menopausal    Comment: first  intercourse 67 y.o.  Other Topics Concern   Not on file  Social History Narrative   Not on file   Social Determinants of Health   Financial Resource Strain: Not on file  Food Insecurity: Not on file  Transportation Needs: Not on file  Physical Activity: Not on file  Stress: Not on file  Social Connections: Not on file  Intimate Partner Violence: Not on file    Review of Systems  PHYSICAL EXAMINATION:    LMP 07/17/2008 (Approximate)     General appearance: alert, cooperative and appears stated age Head: Normocephalic, without obvious abnormality, atraumatic Neck: no adenopathy, supple, symmetrical, trachea midline and  thyroid normal to inspection and palpation Lungs: clear to auscultation bilaterally Breasts: normal appearance, no masses or tenderness, No nipple retraction or dimpling, No nipple discharge or bleeding, No axillary or supraclavicular adenopathy Heart: regular rate and rhythm Abdomen: soft, non-tender, no masses,  no organomegaly Extremities: extremities normal, atraumatic, no cyanosis or edema Skin: Skin color, texture, turgor normal. No rashes or lesions Lymph nodes: Cervical, supraclavicular, and axillary nodes normal. No abnormal inguinal nodes palpated Neurologic: Grossly normal  Pelvic: External genitalia:  no lesions              Urethra:  normal appearing urethra with no masses, tenderness or lesions              Bartholins and Skenes: normal                 Vagina: normal appearing vagina with normal color and discharge, no lesions              Cervix: no lesions                Bimanual Exam:  Uterus:  normal size, contour, position, consistency, mobility, non-tender              Adnexa: no mass, fullness, tenderness              Rectal exam: {yes no:[id number]}.  Confirms.              Anus:  normal sphincter tone, no lesions  Chaperone was present for exam:  ***  ASSESSMENT     PLAN     An After Visit Summary was printed and given to the patient.  ______ minutes face to face time of which over 50% was spent in counseling.

## 2023-01-29 ENCOUNTER — Ambulatory Visit: Payer: Medicare Other | Admitting: Obstetrics and Gynecology

## 2023-01-29 ENCOUNTER — Encounter: Payer: Self-pay | Admitting: Nurse Practitioner

## 2023-01-29 ENCOUNTER — Telehealth: Payer: Medicare Other | Admitting: Nurse Practitioner

## 2023-01-29 DIAGNOSIS — M810 Age-related osteoporosis without current pathological fracture: Secondary | ICD-10-CM | POA: Diagnosis not present

## 2023-01-29 DIAGNOSIS — Z8262 Family history of osteoporosis: Secondary | ICD-10-CM
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
## 2023-01-31 ENCOUNTER — Other Ambulatory Visit: Payer: Self-pay | Admitting: Nurse Practitioner

## 2023-01-31 DIAGNOSIS — M810 Age-related osteoporosis without current pathological fracture: Secondary | ICD-10-CM

## 2023-01-31 MED ORDER — ALENDRONATE SODIUM 70 MG PO TABS: 70.0000 mg | ORAL_TABLET | ORAL | 2 refills | Status: AC

## 2023-03-02 ENCOUNTER — Other Ambulatory Visit: Payer: Self-pay | Admitting: Internal Medicine

## 2023-03-02 DIAGNOSIS — R1900 Intra-abdominal and pelvic swelling, mass and lump, unspecified site: Secondary | ICD-10-CM

## 2023-03-14 ENCOUNTER — Other Ambulatory Visit (HOSPITAL_COMMUNITY): Payer: Self-pay | Admitting: Internal Medicine

## 2023-03-14 DIAGNOSIS — E7800 Pure hypercholesterolemia, unspecified: Secondary | ICD-10-CM

## 2023-03-15 ENCOUNTER — Other Ambulatory Visit (HOSPITAL_COMMUNITY): Payer: Self-pay | Admitting: Internal Medicine

## 2023-03-15 DIAGNOSIS — R1900 Intra-abdominal and pelvic swelling, mass and lump, unspecified site: Secondary | ICD-10-CM

## 2023-03-20 ENCOUNTER — Ambulatory Visit (HOSPITAL_COMMUNITY)
Admission: RE | Admit: 2023-03-20 | Discharge: 2023-03-20 | Disposition: A | Discharge status: Home / Self Care | Payer: Self-pay | Source: Ambulatory Visit | Attending: Internal Medicine | Admitting: Internal Medicine

## 2023-03-20 ENCOUNTER — Ambulatory Visit (HOSPITAL_COMMUNITY): Admission: RE | Admit: 2023-03-20 | Payer: Medicare Other | Source: Ambulatory Visit

## 2023-03-20 DIAGNOSIS — R1900 Intra-abdominal and pelvic swelling, mass and lump, unspecified site: Secondary | ICD-10-CM

## 2023-03-20 DIAGNOSIS — E7800 Pure hypercholesterolemia, unspecified: Secondary | ICD-10-CM

## 2023-03-20 MED ORDER — SODIUM CHLORIDE (PF) 0.9 % IJ SOLN
INTRAMUSCULAR | Status: AC
  Filled 2023-03-20: qty 50

## 2023-03-20 MED ORDER — IOHEXOL 300 MG/ML  SOLN
100.0000 mL | Freq: Once | INTRAMUSCULAR | Status: AC | PRN
  Administered 2023-03-20: 100 mL via INTRAVENOUS

## 2023-04-10 ENCOUNTER — Encounter: Payer: Self-pay | Admitting: Obstetrics and Gynecology

## 2023-11-20 ENCOUNTER — Other Ambulatory Visit: Payer: Self-pay | Admitting: Obstetrics and Gynecology

## 2023-11-20 DIAGNOSIS — Z1231 Encounter for screening mammogram for malignant neoplasm of breast: Secondary | ICD-10-CM

## 2024-01-01 ENCOUNTER — Other Ambulatory Visit: Payer: Self-pay | Admitting: Obstetrics and Gynecology

## 2024-01-01 NOTE — Telephone Encounter (Signed)
 Med refill request: climara  patch Last AEX: 12/04/22 Next AEX: 02/14/24 Last MMG (if hormonal med)  11/01/20 birads cat 1 neg  Refill authorized: last rx 12/04/22 #12 with 3 refills please approve or deny

## 2024-02-12 ENCOUNTER — Encounter: Admitting: Obstetrics and Gynecology

## 2024-02-14 ENCOUNTER — Encounter: Payer: Self-pay | Admitting: Obstetrics and Gynecology

## 2024-02-14 ENCOUNTER — Ambulatory Visit (INDEPENDENT_AMBULATORY_CARE_PROVIDER_SITE_OTHER): Admitting: Obstetrics and Gynecology

## 2024-02-14 VITALS — BP 110/74 | HR 76 | Ht 64.0 in | Wt 127.0 lb

## 2024-02-14 DIAGNOSIS — Z79890 Hormone replacement therapy (postmenopausal): Secondary | ICD-10-CM | POA: Diagnosis not present

## 2024-02-14 DIAGNOSIS — M810 Age-related osteoporosis without current pathological fracture: Secondary | ICD-10-CM | POA: Diagnosis not present

## 2024-02-14 DIAGNOSIS — Z9189 Other specified personal risk factors, not elsewhere classified: Secondary | ICD-10-CM | POA: Diagnosis not present

## 2024-02-14 DIAGNOSIS — Z87440 Personal history of urinary (tract) infections: Secondary | ICD-10-CM | POA: Diagnosis not present

## 2024-02-14 DIAGNOSIS — Z5181 Encounter for therapeutic drug level monitoring: Secondary | ICD-10-CM | POA: Diagnosis not present

## 2024-02-14 DIAGNOSIS — Z01419 Encounter for gynecological examination (general) (routine) without abnormal findings: Secondary | ICD-10-CM

## 2024-02-14 MED ORDER — NITROFURANTOIN MONOHYD MACRO 100 MG PO CAPS: 100.0000 mg | ORAL_CAPSULE | Freq: Two times a day (BID) | ORAL | 0 refills | Status: AC

## 2024-02-14 MED ORDER — CLIMARA PRO 0.045-0.015 MG/DAY TD PTWK: MEDICATED_PATCH | TRANSDERMAL | 3 refills | Status: DC
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
## 2024-02-14 NOTE — Patient Instructions (Signed)

## 2024-03-10 ENCOUNTER — Ambulatory Visit: Payer: Self-pay | Admitting: Obstetrics and Gynecology

## 2024-03-10 ENCOUNTER — Ambulatory Visit
Admission: RE | Admit: 2024-03-10 | Discharge: 2024-03-10 | Disposition: A | Discharge status: Home / Self Care | Source: Ambulatory Visit | Attending: Obstetrics and Gynecology | Admitting: Obstetrics and Gynecology

## 2024-03-10 DIAGNOSIS — Z1231 Encounter for screening mammogram for malignant neoplasm of breast: Secondary | ICD-10-CM

## 2024-03-31 ENCOUNTER — Other Ambulatory Visit: Payer: Self-pay | Admitting: Radiology

## 2024-03-31 NOTE — Telephone Encounter (Signed)
.  Med refill request: Climara  pro Last AEX: 02/14/24 Next AEX: 03/09/25 Last MMG (if hormonal med) 03/10/24  BI-RADS CATEGORY 1: Negative.  Refill authorized: Please Advise?

## 2024-07-08 ENCOUNTER — Emergency Department (HOSPITAL_COMMUNITY)
Admission: EM | Admit: 2024-07-08 | Discharge: 2024-07-08 | Disposition: A | Discharge status: Home / Self Care | Source: Ambulatory Visit | Attending: Emergency Medicine | Admitting: Emergency Medicine

## 2024-07-08 ENCOUNTER — Encounter (HOSPITAL_COMMUNITY): Payer: Self-pay | Admitting: Emergency Medicine

## 2024-07-08 ENCOUNTER — Other Ambulatory Visit: Payer: Self-pay

## 2024-07-08 DIAGNOSIS — M79604 Pain in right leg: Secondary | ICD-10-CM | POA: Diagnosis present

## 2024-07-08 LAB — I-STAT CHEM 8, ED
BUN: 18 mg/dL (ref 8–23)
Calcium, Ion: 1.11 mmol/L — ABNORMAL LOW (ref 1.15–1.40)
Chloride: 105 mmol/L (ref 98–111)
Creatinine, Ser: 0.7 mg/dL (ref 0.44–1.00)
Glucose, Bld: 88 mg/dL (ref 70–99)
HCT: 38 % (ref 36.0–46.0)
Hemoglobin: 12.9 g/dL (ref 12.0–15.0)
Potassium: 3.9 mmol/L (ref 3.5–5.1)
Sodium: 139 mmol/L (ref 135–145)
TCO2: 23 mmol/L (ref 22–32)

## 2024-07-08 MED ORDER — RIVAROXABAN 15 MG PO TABS
15.0000 mg | ORAL_TABLET | Freq: Once | ORAL | Status: AC
  Administered 2024-07-08: 15 mg via ORAL
  Filled 2024-07-08: qty 1

## 2024-07-08 NOTE — ED Provider Notes (Signed)
 " [place] EMERGENCY DEPARTMENT AT [facility] Provider Note   CSN: [id number] Arrival date & time: 07/08/24  1650     Patient presents with: Leg Pain   Heidi Skinner is a 68 y.o. female.   Patient is a 68 year old female who presents with swelling discomfort to her right upper thigh.  [facility] and was [facility] here for possible DVT.  She said has been going on for the last few days.  She has been on a lot of recent flights.  She denies any swelling of the leg.  No other injuries to the leg.  No fevers.  No prior history of DVT.  No chest pain or shortness of breath.       Prior to Admission medications  Medication Sig Start Date End Date Taking? Authorizing Provider  alendronate  (FOSAMAX ) 70 MG tablet Take 1 tablet (70 mg total) by mouth every 7 (seven) days. Take with a full glass of water on an empty stomach. 01/31/23   Prentiss Annabella LABOR, NP  Bromfenac Sodium 0.07 % SOLN Apply 1 drop to eye 3 (three) times daily. 02/05/24   [provider]  calcium gluconate 500 MG tablet Take 1 tablet by mouth daily.    [provider]  Cholecalciferol (VITAMIN D3) 1000 units CAPS Take 1,000 Units by mouth daily.    [provider]  CLIMARA  PRO 0.045-0.015 MG/DAY APPLY 1 PATCH TOPICALLY TO THE SKIN 1 TIME WEEKLY 03/31/24   Amundson C Silva, Brook E, MD  fexofenadine (ALLEGRA) 180 MG tablet Take 180 mg by mouth daily.    [provider]  FINACEA 15 % cream Apply topically. 02/23/20   [provider]  ibuprofen  (ADVIL ) 600 MG tablet Take 1 tablet (600 mg total) by mouth every 6 (six) hours as needed. 02/16/22   Dean Clarity, MD  MIEBO 1.338 GM/ML SOLN  01/30/24   [provider]  minocycline (MINOCIN) 50 MG capsule Take 50 mg by mouth daily as needed. 03/31/20   [provider]  nitrofurantoin , macrocrystal-monohydrate, (MACROBID ) 100 MG capsule Take 1 capsule (100 mg total) by mouth 2 (two) times daily. Take  twice daily for 5 days as needed for urinary tract infection. 02/14/24   Amundson C Silva, Brook E, MD  RESTASIS 0.05 % ophthalmic emulsion 1 drop 2 (two) times daily. 11/23/23   [provider]  VEVYE 0.1 % SOLN Apply 1 drop to eye 2 (two) times daily. 02/04/24   [provider]    Allergies: Sulfa antibiotics, Lifitegrast, Ampicillin, and Penicillins    Review of Systems  Constitutional:  Negative for fever.  Respiratory:  Negative for shortness of breath.   Cardiovascular:  Negative for chest pain.  Gastrointestinal:  Negative for nausea and vomiting.  Musculoskeletal:  Positive for myalgias. Negative for arthralgias, back pain, joint swelling and neck pain.  Skin:  Negative for wound.  Neurological:  Negative for weakness, numbness and headaches.    Updated Vital Signs BP 133/78 (BP Location: Left Arm)   Pulse 68   Temp 98.1 F (36.7 C) (Oral)   Resp 16   LMP 07/17/2008   SpO2 100%   Physical Exam Constitutional:      Appearance: She is well-developed.  HENT:     Head: Normocephalic and atraumatic.  Cardiovascular:     Rate and Rhythm: Normal rate.  Pulmonary:     Effort: Pulmonary effort is normal.  Musculoskeletal:  General: No tenderness.     Cervical back: Normal range of motion and neck supple.     Comments: Patient has some discomfort in the right proximal thigh but no tenderness on palpation.  No warmth or erythema.  There is a small area of ecchymosis to the medial aspect of the knee.  No bony tenderness to the leg.  Pedal pulses intact.  She has normal sensation and motor function distally.  Skin:    General: Skin is warm and dry.  Neurological:     Mental Status: She is alert and oriented to person, place, and time.     (all labs ordered are listed, but only abnormal results are displayed) Labs Reviewed  I-STAT CHEM 8, ED - Abnormal; Notable for the following components:      Result Value   Calcium, Ion 1.11 (*)    All other  components within normal limits    EKG: None  Radiology: No results found.   Procedures   Medications Ordered in the ED  Rivaroxaban  (XARELTO ) tablet 15 mg (has no administration in time range)                                    Medical Decision Making Risk Prescription drug management.   Patient is a 68 year old who presents with some minor discomfort in her right mid thigh area.  There is no swelling of the leg.  She has good pedal pulses.  No neurologic deficit.  No bony tenderness to be more concerning for underlying fracture.  Unfortunately we do not have vascular ultrasound here to rule out DVT.  I have verified that they are doing outpatient ultrasounds [facility].  She has been ordered for a stat outpatient ultrasound.  Discussed risk and benefits and she has opted to go ahead and get 1 dose of Xarelto  to today prior to leaving to cover her until tomorrow morning when she goes for the ultrasound.  An i-STAT chemistry was done which shows a normal creatinine.  She does not have any chest pain, shortness of breath or other symptoms that we more concerning for PE.  No hypoxia or tachycardia.  She was discharged home in good condition.  She will get her ultrasound tomorrow.  She was encouraged to follow-up with her PCP.  Return precautions were given.     Final diagnoses:  Right leg pain    ED Discharge Orders          Ordered    LE Venous       Comments: IMPORTANT PATIENT INSTRUCTIONS: You have been scheduled for an Outpatient Vascular Study at [facility].  If tomorrow is a Saturday, Sunday or holiday, please go to the [facility] Emergency Department Registration Desk at 11 am tomorrow morning and tell them you are there for a vascular study.  If tomorrow is a weekday (Monday-Friday), please go to the [facility] (address [address] [place]) at 8 am and report to the 4th floor registration Zone  A.  Inform registration that you are there for a vascular study.   07/08/24 1749               Lenor Hollering, MD 07/08/24 1756  "

## 2024-07-08 NOTE — ED Triage Notes (Addendum)
 Pt reports rt upper thigh pain. Was seen at ortho & sent to ED to r/o blood clot. Denies redness or swelling just pain. Reports she has taken long flights recently. Went to [place] 2 weeks ago and flew back from [place] yesterday.

## 2024-07-08 NOTE — Discharge Instructions (Addendum)
 Make an appointment to follow-up with your primary care doctor.  Return to the emergency room if you have any worsening symptoms.

## 2024-07-09 ENCOUNTER — Ambulatory Visit (HOSPITAL_COMMUNITY)
Admission: RE | Admit: 2024-07-09 | Discharge: 2024-07-09 | Disposition: A | Discharge status: Home / Self Care | Source: Ambulatory Visit | Attending: Emergency Medicine | Admitting: Emergency Medicine

## 2024-07-09 DIAGNOSIS — M7989 Other specified soft tissue disorders: Secondary | ICD-10-CM

## 2024-07-09 DIAGNOSIS — M79651 Pain in right thigh: Secondary | ICD-10-CM | POA: Diagnosis present

## 2024-08-13 ENCOUNTER — Telehealth: Payer: Self-pay

## 2024-08-13 NOTE — Telephone Encounter (Signed)
 Contacted patient to schedule a colonoscopy and inquire about her previous colonoscopy with Dr. Luis. Patient reports she had her last colonoscopy with Dr. Luis around 5 years ago. Patient does beleibe she may have the report at home and will contact me if she finds the report. Informed patient in mean time I will email a ROI to her email address ([email or web address]). Patient verbalized understanding. Also, informed patient I will contact her when the April schedule is available to schedule her colonoscopy with Dr. Albertus.

## 2024-08-13 NOTE — Telephone Encounter (Signed)
 SABRA

## 2024-08-21 NOTE — Telephone Encounter (Signed)
 Received signed faxed ROI from patient. Faxed ROI to Dr. Marshell office.

## 2024-08-22 NOTE — Telephone Encounter (Signed)
 Left detailed message informing patient I received her ROI and did fax it to Dr. Marshell office and also that our April schedule is available and for patient to return my call.

## 2024-08-28 ENCOUNTER — Ambulatory Visit: Admitting: Family Medicine

## 2025-03-09 ENCOUNTER — Encounter: Admitting: Obstetrics and Gynecology
